# Patient Record
Sex: Male | Born: 1960 | Race: White | Hispanic: No | Marital: Married | Smoking: Never smoker
Health system: Southern US, Community
[De-identification: ages and names within clinical notes are randomized; demographics above are authoritative.]

## PROBLEM LIST (undated history)

## (undated) DIAGNOSIS — C4491 Basal cell carcinoma of skin, unspecified: Secondary | ICD-10-CM

## (undated) DIAGNOSIS — J302 Other seasonal allergic rhinitis: Secondary | ICD-10-CM

## (undated) DIAGNOSIS — R7303 Prediabetes: Secondary | ICD-10-CM

## (undated) DIAGNOSIS — H409 Unspecified glaucoma: Secondary | ICD-10-CM

## (undated) DIAGNOSIS — K859 Acute pancreatitis without necrosis or infection, unspecified: Secondary | ICD-10-CM

## (undated) HISTORY — DX: Basal cell carcinoma of skin, unspecified: C44.91

## (undated) HISTORY — PX: OTHER SURGICAL HISTORY: SHX169

## (undated) HISTORY — DX: Other seasonal allergic rhinitis: J30.2

## (undated) HISTORY — PX: WISDOM TOOTH EXTRACTION: SHX21

## (undated) HISTORY — DX: Unspecified glaucoma: H40.9

## (undated) HISTORY — DX: Acute pancreatitis without necrosis or infection, unspecified: K85.90

## (undated) HISTORY — DX: Prediabetes: R73.03

---

## 2006-05-03 ENCOUNTER — Encounter (INDEPENDENT_AMBULATORY_CARE_PROVIDER_SITE_OTHER): Payer: Self-pay | Admitting: *Deleted

## 2006-05-03 ENCOUNTER — Ambulatory Visit (HOSPITAL_COMMUNITY): Admission: RE | Admit: 2006-05-03 | Discharge: 2006-05-03 | Payer: Self-pay | Admitting: Emergency Medicine

## 2006-05-03 ENCOUNTER — Emergency Department (HOSPITAL_COMMUNITY): Admission: EM | Admit: 2006-05-03 | Discharge: 2006-05-03 | Payer: Self-pay | Admitting: Emergency Medicine

## 2006-05-06 ENCOUNTER — Encounter (INDEPENDENT_AMBULATORY_CARE_PROVIDER_SITE_OTHER): Payer: Self-pay | Admitting: *Deleted

## 2006-05-06 ENCOUNTER — Encounter: Payer: Self-pay | Admitting: Internal Medicine

## 2006-05-06 ENCOUNTER — Inpatient Hospital Stay (HOSPITAL_COMMUNITY): Admission: EM | Admit: 2006-05-06 | Discharge: 2006-05-11 | Payer: Self-pay | Admitting: Emergency Medicine

## 2006-05-07 ENCOUNTER — Encounter (INDEPENDENT_AMBULATORY_CARE_PROVIDER_SITE_OTHER): Payer: Self-pay | Admitting: *Deleted

## 2006-05-08 ENCOUNTER — Encounter: Payer: Self-pay | Admitting: Internal Medicine

## 2006-05-09 ENCOUNTER — Encounter (INDEPENDENT_AMBULATORY_CARE_PROVIDER_SITE_OTHER): Payer: Self-pay | Admitting: *Deleted

## 2006-05-09 ENCOUNTER — Encounter: Payer: Self-pay | Admitting: Internal Medicine

## 2006-05-10 ENCOUNTER — Encounter: Payer: Self-pay | Admitting: Internal Medicine

## 2006-05-11 ENCOUNTER — Encounter: Payer: Self-pay | Admitting: Internal Medicine

## 2006-05-16 ENCOUNTER — Ambulatory Visit: Payer: Self-pay | Admitting: Internal Medicine

## 2006-05-17 ENCOUNTER — Ambulatory Visit: Payer: Self-pay | Admitting: Internal Medicine

## 2006-05-19 ENCOUNTER — Ambulatory Visit: Payer: Self-pay | Admitting: Internal Medicine

## 2006-06-23 ENCOUNTER — Ambulatory Visit: Payer: Self-pay | Admitting: Internal Medicine

## 2006-06-23 LAB — CONVERTED CEMR LAB
Basophils Absolute: 0.1 10*3/uL (ref 0.0–0.1)
Basophils Relative: 1.1 % — ABNORMAL HIGH (ref 0.0–1.0)
Eosinophils Absolute: 0.4 10*3/uL (ref 0.0–0.6)
HCT: 43.5 % (ref 39.0–52.0)
Hemoglobin: 15.2 g/dL (ref 13.0–17.0)
MCHC: 35 g/dL (ref 30.0–36.0)
MCV: 85.8 fL (ref 78.0–100.0)
Monocytes Absolute: 0.4 10*3/uL (ref 0.2–0.7)
Neutro Abs: 4.5 10*3/uL (ref 1.4–7.7)
RDW: 12.7 % (ref 11.5–14.6)

## 2007-01-19 ENCOUNTER — Ambulatory Visit: Payer: Self-pay | Admitting: Internal Medicine

## 2007-01-19 LAB — CONVERTED CEMR LAB
Basophils Relative: 0.7 % (ref 0.0–1.0)
HCT: 44.2 % (ref 39.0–52.0)
MCHC: 34.3 g/dL (ref 30.0–36.0)
Neutrophils Relative %: 67.9 % (ref 43.0–77.0)
RBC: 5.22 M/uL (ref 4.22–5.81)

## 2010-03-16 ENCOUNTER — Ambulatory Visit: Payer: Self-pay | Admitting: Gastroenterology

## 2010-03-16 ENCOUNTER — Telehealth: Payer: Self-pay | Admitting: Internal Medicine

## 2010-03-16 DIAGNOSIS — Z8719 Personal history of other diseases of the digestive system: Secondary | ICD-10-CM

## 2010-03-16 DIAGNOSIS — R141 Gas pain: Secondary | ICD-10-CM

## 2010-03-16 DIAGNOSIS — R109 Unspecified abdominal pain: Secondary | ICD-10-CM

## 2010-03-16 DIAGNOSIS — R143 Flatulence: Secondary | ICD-10-CM

## 2010-03-16 DIAGNOSIS — Z85828 Personal history of other malignant neoplasm of skin: Secondary | ICD-10-CM

## 2010-03-16 DIAGNOSIS — R142 Eructation: Secondary | ICD-10-CM

## 2010-03-16 DIAGNOSIS — J309 Allergic rhinitis, unspecified: Secondary | ICD-10-CM | POA: Insufficient documentation

## 2010-03-16 DIAGNOSIS — C4491 Basal cell carcinoma of skin, unspecified: Secondary | ICD-10-CM | POA: Insufficient documentation

## 2010-03-16 DIAGNOSIS — K56609 Unspecified intestinal obstruction, unspecified as to partial versus complete obstruction: Secondary | ICD-10-CM | POA: Insufficient documentation

## 2010-03-16 DIAGNOSIS — R1033 Periumbilical pain: Secondary | ICD-10-CM | POA: Insufficient documentation

## 2010-03-17 ENCOUNTER — Ambulatory Visit: Payer: Self-pay | Admitting: Internal Medicine

## 2010-03-17 LAB — CONVERTED CEMR LAB
Basophils Relative: 0 % (ref 0.0–3.0)
Calcium: 9.1 mg/dL (ref 8.4–10.5)
Chloride: 103 meq/L (ref 96–112)
Creatinine, Ser: 0.9 mg/dL (ref 0.4–1.5)
Eosinophils Absolute: 0.3 10*3/uL (ref 0.0–0.7)
GFR calc non Af Amer: 92.16 mL/min (ref >60)
Lymphs Abs: 2 10*3/uL (ref 0.7–4.0)
MCHC: 34.7 g/dL (ref 30.0–36.0)
MCV: 86.7 fL (ref 78.0–100.0)
Monocytes Relative: 6.7 % (ref 3.0–12.0)
Platelets: 269 10*3/uL (ref 150.0–400.0)
RBC: 4.88 M/uL (ref 4.22–5.81)
Sodium: 139 meq/L (ref 135–145)
WBC: 14.4 10*3/uL — ABNORMAL HIGH (ref 4.5–10.5)

## 2010-03-18 ENCOUNTER — Ambulatory Visit: Payer: Self-pay | Admitting: Physician Assistant

## 2010-03-18 DIAGNOSIS — Z8719 Personal history of other diseases of the digestive system: Secondary | ICD-10-CM | POA: Insufficient documentation

## 2010-03-19 ENCOUNTER — Ambulatory Visit (HOSPITAL_COMMUNITY): Admission: RE | Admit: 2010-03-19 | Discharge: 2010-03-19 | Payer: Self-pay | Admitting: Gastroenterology

## 2010-03-19 LAB — CONVERTED CEMR LAB
ALT: 27 units/L (ref 0–53)
AST: 21 units/L (ref 0–37)
Albumin: 3.8 g/dL (ref 3.5–5.2)
Alkaline Phosphatase: 70 units/L (ref 39–117)
Amylase: 39 units/L (ref 27–131)
Basophils Absolute: 0 10*3/uL (ref 0.0–0.1)
Basophils Relative: 0.4 % (ref 0.0–3.0)
Eosinophils Relative: 3.7 % (ref 0.0–5.0)
Hemoglobin: 15.2 g/dL (ref 13.0–17.0)
Lipase: 23 units/L (ref 11.0–59.0)
MCV: 87.7 fL (ref 78.0–100.0)
Neutrophils Relative %: 71.3 % (ref 43.0–77.0)
Platelets: 271 10*3/uL (ref 150.0–400.0)
RBC: 5.1 M/uL (ref 4.22–5.81)
RDW: 13.7 % (ref 11.5–14.6)
Total Protein: 6.7 g/dL (ref 6.0–8.3)
WBC: 8.4 10*3/uL (ref 4.5–10.5)

## 2010-03-23 ENCOUNTER — Ambulatory Visit: Payer: Self-pay | Admitting: Internal Medicine

## 2010-06-29 NOTE — Progress Notes (Signed)
Summary: Triage  Phone Note Call from Patient Call back at [phone]   Caller: Patient Call For: Dr. Juanda Chance Reason for Call: Talk to Nurse Summary of Call: Abd pain, bloating/discomfort. "awake" all night Initial call taken by: Karna Christmas,  March 16, 2010 9:11 AM  Follow-up for Phone Call        Pt called to report abdominal pain x 2 days. States"I have not been able to sleep for 2 nights. " C/O bloating, crampy pain above belly button and around it. Denies reflux, constipation, diarrhea, nausea, vomiting and fever. States he has had "sweats at times." Patient took Tylenol without relief of pain. He took Oxycodone with relief last PM. Pt states he was treated for parasites in 2007.Patient scheduled to see Mike Gip, PA today at 2:30 PM Follow-up by: Jesse Fall RN,  March 16, 2010 12:45 PM

## 2010-06-29 NOTE — Assessment & Plan Note (Signed)
Summary: abd pain;pancreatitis//dn   History of Present Illness Visit Type: Follow-up Visit Primary GI MD: Lina Sar MD Primary Provider: Elvina Sidle, MD  Requesting Provider: na Chief Complaint: F/u for pancreatitis, patient saw Amy on 03-16-10. Pt  states that he is feeling better and denies any GI complaints History of Present Illness:   This is a 50 year old white male with acute  pancreatitis involving the tail of the pancreas demonstrated on a CT scan of the abdomen on 03/17/10. The scan was ordered because of an episode of periumbilical abdominal pain which occurred rather suddenly and lasted for about 2 days. Patient had a low-grade temperature recorded at home but not documented in our office. His white blood cell count was elevated to 14,000. The CT Scan of the abdomen did not show any structural  ab normality of the gallbladder or liver. His liver function tests including an amylase and lipase were normal. There was focal edema of the tail of the pancreas but no mass. He does not drink alcohol. He had a large  pizza meal  the night before. There is family history of gallbladder disease in a maternal grandmother   GI Review of Systems      Denies abdominal pain, acid reflux, belching, bloating, chest pain, dysphagia with liquids, dysphagia with solids, heartburn, loss of appetite, nausea, vomiting, vomiting blood, weight loss, and  weight gain.        Denies anal fissure, black tarry stools, change in bowel habit, constipation, diarrhea, diverticulosis, fecal incontinence, heme positive stool, hemorrhoids, irritable bowel syndrome, jaundice, light color stool, liver problems, rectal bleeding, and  rectal pain.    Current Medications (verified): 1)  Percocet 5-325 Mg Tabs (Oxycodone-Acetaminophen) .... Take 1 Tab Every 4-6 Hours For Pain 2)  Zyrtec-D Allergy & Congestion 5-120 Mg Xr12h-Tab (Cetirizine-Pseudoephedrine) .... As Needed 3)  Tylenol 325 Mg Tabs (Acetaminophen) ....  As Needed 4)  Multivitamins   Tabs (Multiple Vitamin) .... One Tablet By Mouth Once Daily  Allergies (verified): 1)  ! Nsaids  Past History:  Past Medical History: Small Bowel Enteritis- 05/06/2006 PANCREATITIS (ICD-577.0) SMALL BOWEL OBSTRUCTION, HX OF (ICD-V12.79) ABDOMINAL PAIN, PERIUMBILIC (ICD-789.05) CARCINOMA, BASAL CELL, HX OF (ICD-V10.83) ALLERGIC RHINITIS CAUSE UNSPECIFIED (ICD-477.9) ABDOMINAL BLOATING (ICD-787.3) ABDOMINAL PAIN, LOWER (ICD-789.09) HX OF ACUTE ENTERITIS/TRANSIENT SBO 12/07  Past Surgical History: Reviewed history from 03/16/2010 and no changes required. Unremarkable  Family History: Reviewed history from 03/16/2010 and no changes required. Family History of Heart Disease: mother Family History Melanoma- Father Family Hx of HTN - father No FH of Colon Cancer:  Social History: Reviewed history from 03/16/2010 and no changes required. 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  Alcohol Use - no Daily Caffeine Use: 2 daily  Illicit Drug Use - no  Review of Systems  The patient denies allergy/sinus, anemia, anxiety-new, arthritis/joint pain, back pain, blood in urine, breast changes/lumps, change in vision, confusion, cough, coughing up blood, depression-new, fainting, fatigue, fever, headaches-new, hearing problems, heart murmur, heart rhythm changes, itching, menstrual pain, muscle pains/cramps, night sweats, nosebleeds, pregnancy symptoms, shortness of breath, skin rash, sleeping problems, sore throat, swelling of feet/legs, swollen lymph glands, thirst - excessive , urination - excessive , urination changes/pain, urine leakage, vision changes, and voice change.         Pertinent positive and negative review of systems were noted in the above HPI. All other ROS was otherwise negative.   Vital Signs:  Patient profile:   51 year old male Height:  78 inches Weight:      266 pounds BMI:     30.85 BSA:     2.55 Pulse rate:    88 / minute Pulse rhythm:   regular BP sitting:   126 / 74  (left arm) Cuff size:   regular  Vitals Entered By: Ok Anis CMA (March 23, 2010 2:47 PM)  Physical Exam  General:  Well developed, well nourished, no acute distress. Eyes:  PERRLA, no icterus. Lungs:  Clear throughout to auscultation. Heart:  Regular rate and rhythm; no murmurs, rubs,  or bruits. Abdomen:  abdomen was soft and nondistended with normoactive bowel sounds. I could not elicit any tenderness in any of the quadrants. There was no fullness. Liver edge at costal margin. Extremities:  No clubbing, cyanosis, edema or deformities noted. Skin:  Intact without significant lesions or rashes. Psych:  Alert and cooperative. Normal mood and affect.   Impression & Recommendations:  Problem # 1:  PANCREATITIS (ICD-577.0) Patient had evidence of acute focal pancreatitis on CT scan imaging correlating with the episode of acute abdominal pain. Ethiology  is not clear. Pancreatic and liver enzymes have been normal. He does not drink alcohol. There is no mass in the pancreas. Biliary pancreatitis is still a possibility. The patient is not interested in gallbladder surgery at this time.  There is not enough evidence to request surgical consultation anyway.Marland Kitchen He should stay on a low-fat diet and report to [place] any recurrence of the abdominal pain. I would consider a HIDA scan at some point and repeating his liver tests and pancreatic enzymes during an attack.  Problem # 2:  SMALL BOWEL OBSTRUCTION (ICD-560.9) Patient had an episode of acute abdominal pain in 2008. This is currently not a problem.  Patient Instructions: 1)  Please follow up as needed. 2)  low fat diet 3)  Call us with symptoms and any recurrence of abdominal pain so we could consider a HIDA scan and repeating pancreatic enzymes. 4)  Copy sent to : Elvina Sidle, MD 5)  The medication list was reviewed and reconciled.  All changed / newly prescribed medications  were explained.  A complete medication list was provided to the patient / caregiver.

## 2010-06-29 NOTE — Discharge Summary (Signed)
Summary: Partial Small Bowel Obstruction   NAME:  Barry Thomas, Barry Thomas                 ACCOUNT NO.:  [id number]   MEDICAL RECORD NO.:  [id number]          PATIENT TYPE:  INP   LOCATION:  1609                         FACILITY:  [facility]   PHYSICIAN:  Andres Shad. Rudean Curt, MD     DATE OF BIRTH:  1960/11/24   DATE OF ADMISSION:  05/06/2006  DATE OF DISCHARGE:  05/11/2006                               DISCHARGE SUMMARY   DISCHARGE DIAGNOSES:  Small bowel enteritis of unknown cause.   DISCHARGE MEDICATIONS:  Protonix 40 mg p.o. daily.   HOSPITAL COURSE:  Barry Thomas is a 50 year old male with no significant  past medical history who was admitted to [facility] on  May 06, 2006 with the chief complaint of abdominal pain of three  weeks duration.  The abdominal pain was crampy in nature and was  associated with some bouts of diarrhea.  He denied any blood in the  diarrhea.  He did not have any nausea or vomiting. He also denied fever  or constitutional symptoms.  He was seen in the emergency department  approximately three days before admission where he was found to have  mildly elevated white count.  He was given IV fluids and oral pain  medication and discharged.  A followup ultrasound was performed that was  normal.  He then returned to the emergency department on May 06, 2006 complaining of abdominal pain, cramps and some diarrhea.  On  admission he was found to have a temperature of 99.1, blood pressure  141/92, pulse 100, respiratory rate 16 and oxygen saturation 96% on room  air.  His laboratory studies included a white blood cell count of 12,000  and were otherwise within normal limits.  CT scan of the abdomen was  performed which showed some thickening of the distal loops of the small  bowel with free pelvic fluid.  This was compatible with enteritis with a  broad differential diagnosis including infectious, inflammatory and  ischemic processes.  No vascular disease was noted on  this study.   The patient was admitted to the hospital where he was kept NPO at first  and given Protonix.  He was initially treated with antibiotics to cover  bowel flora and gastroenterology was consulted.  A number of studies  were performed.  These included an upper GI series on May 08, 2006  which showed a proximal area of mild inflammation with suboptimal  filling.  This was consistent with an inflammatory process of the small  bowel.  EGD was performed on May 09, 2006 with small bowel  enteroscopy.  This study did not reveal any abnormalities.  Random  jejunal biopsies were taken.  The enteroscopy reached as far as the  Ligament of Treitz.  At the time of discharge the small bowel biopsies  were not available.  A capsule enteroscopy was underway at the time of  discharge.  The patient remained clinically well.  He was able to  advance his diet to a full diet without complications.  IV fluids were  discontinued the day  before discharge.  At that time gastroenterology  recommended discharge and followup in the office.   ASSESSMENT:  Patient is a 50 year old with small bowel enteritis of  unknown cause, likely infectious versus inflammatory.   PLAN:  1. Followup in the office with Dr. Juanda Chance from gastroenterology.  2. Continue Protonix 40 mg daily as an outpatient.  3. Avoid nonsteroidal anti-inflammatory drugs.  4. Followup results of small bowel jejunal biopsies as well as      enteroscopy.      Andres Shad. Rudean Curt, MD  Electronically Signed     PML/MEDQ  D:  05/11/2006  T:  05/11/2006  Job:  [id number]   cc:   Hedwig Morton. Juanda Chance, MD  [address]  [place]  [place] [address]   Magnus Sinning. Rice, M.D.  Fax: [phone]

## 2010-06-29 NOTE — Procedures (Signed)
Summary: EGD   EGD  Procedure date:  05/09/2006  Findings:      Location: [facility]   Patient Name: Barry Thomas, Barry Thomas MRN: [id number] Procedure Procedures: Small Bowel Enteroscopy CPT: 44360.    with biopsy(s). CPT: W4506749.  Personnel: Endoscopist: Lizzett Nobile L. Juanda Chance, MD.  Exam Location: Exam performed in Endoscopy Suite.  Patient Consent: Procedure, Alternatives, Risks and Benefits discussed, consent obtained, from patient. Consent was obtained by the RN.  Indications  Abnormal Exams, Studies: CT scan, abnormal, do not suspect malignancy.  Symptoms: Abdominal pain, location: diffuse.  Comments: dilated jejunum, partial small bowl obstruction on SBFT, elevated eos. count History  Current Medications: Patient is not currently taking Coumadin.  Comments: Patient history reviewed/updated, physical exam performed prior to initiation of sedation? Pre-Exam Physical: Performed May 09, 2006  Entire physical exam was normal.  Comments: Pt. history reviewed/updated, physical exam performed prior to initiation of sedation?yes Exam Exam Info: Maximum depth of insertion Jejunum, intended Jejunum. Vocal cords visualized. Gastric retroflexion performed. Images taken. ASA Classification: I. Tolerance: good.  Sedation Meds: Patient assessed and found to be appropriate for moderate (conscious) sedation. Fentanyl 75 mcg. given IV. Versed 8 mg. given IV. Cetacaine Spray 2 sprays given aerosolized.  Monitoring: BP and pulse monitoring done. Oximetry used. Supplemental O2 given  Findings Normal: Distal Esophagus.  - Normal: Duodenal Apex to Jejunum. Comments: normal calibre jejunum and duodenum, no inflammatory changes,s/p random biopsies.  Normal: Antrum.  OTHER FINDING: in Jejunum. Comments: reached distal to the Lig of treitz.   Assessment Normal examination.  Comments: s/p random jejunal biopsies Events  Unplanned Intervention: No unplanned interventions were  required.  Unplanned Events: There were no complications. Plans Medication(s): Await pathology.  Comments: small bowl capsule endoscopy Disposition: After procedure patient sent to recovery.    cc: Renford Dills, MD   This report was created from the original endoscopy report, which was reviewed and signed by the above listed endoscopist.

## 2010-06-29 NOTE — Consult Note (Signed)
Summary: Abdominal Pain   NAME:  Barry Thomas, Barry Thomas                 ACCOUNT NO.:  [id number]   MEDICAL RECORD NO.:  [id number]          PATIENT TYPE:  INP   LOCATION:  1609                         [facility]ACILITY:  [facility]   PHYSICIAN:  Anselm Pancoast. Weatherly, M.D.DATE OF BIRTH:  05-04-1961   DATE OF CONSULTATION:  05/07/2006  DATE OF DISCHARGE:                                 CONSULTATION   CHIEF COMPLAINT:  Abdominal pain.   HISTORY:  Barry Thomas is a 50 year old Caucasian male admitted  through the ER by Dr. Nehemiah Settle with intermittent episodes of epigastric  pain that has occurred over a 2-week interval.  The patient's first  symptoms sound more like a right upper quadrant cramping sensation, he  did have a loose bowel movement and he saw his physician several days  later.  Ultrasound of the gallbladder was performed which was reported  to be normal.  His symptoms subsided and then about 3 days later he had  another episode of pain, sort of crampy and bloated, does not actually  throw up, usually it is associated with a large kind of liquid bowel  movement and then on Friday night a similar episode of pain occurred and  after several hours he came to the emergency room.  He was evaluated in  the ER, treated with pain medication, his white count was 12,700 and  plain abdominal films and then a CT was performed.  The CT showed a  little thickening of the distal small bowel but it was not that of  obvious obstruction and he was seen by the medical physician who  arranged for his admission but asked for a general surgical consultation  in case surgery would be needed.  On questioning with the patient, he  has not had an episode of kind of frequent diarrhea, weight loss, any  foods that he is normally not able to tolerate, he is moderately  overweight and the only other history of similar family members, etc.,  he has a grandfather or possibly a father-in-law who has had Clostridium  difficile  colitis that he has been around but the patient himself has  not been on any oral antibiotics to kind of predispose him to a possible  Clostridium difficile colitis.  Also on his CT there is no evidence of  any thickening of the colon and there is also no evidence of any  thickening of the gallbladder noted on the CT.   On physical exam in the emergency room he was afebrile, was fairly  comfortable, he had received some pain medications initially but it had  been 6 or 7 hours since this had been given and on bowel sounds there  was not any hyperactive obstructive type bowel symptoms and on rectal  exam the stools were hemoccult negative and very little if any true  stool in his rectum when I did his rectal exam.  I recommended that we  get a Clostridium difficile and also kind of  follow along.  I doubt  that this is a surgical problem.  I think we have  now two or three  episodes spaced several days apart and a little thickening noted in the  distal small bowel, a possible little intermittent intussusception would  possibly give this type of intermittent crampy and bloated pain with a  loose bowel movement but he has not had any blood in his stool and the  CT is really not as thickened as I would expect if the patient has early  Crohn's disease and he has also not had the symptoms of any significant  time duration for weight loss.  In followup the Clostridium difficile  that was ordered was negative and now approximately 24 hours later he is  resting comfortable, he is tolerating clear liquid diet, his white count  today is 8800 and he is not distended or bloated.  He has really not had  any further significant diarrhea and I would think a small bowel series  is needed on Monday but doubt that this is a surgical problem.  It is  probably a viral gastroenteritis with these episodes of pain.  The pain  itself is fairly typical for a gallbladder attack but there is no stones  seen on the  ultrasound or thickening of the gallbladder so that is  most unlikely.  We will arrange for a small-bowel series to be performed  today.  I think the patient is going down for x-ray at this time to make  sure there is no small bowel dilatation or blockage but clinically that  does not appear to be an issue.  I will follow along with the patient  but doubt that a surgical problem exists.           ______________________________  Anselm Pancoast. Zachery Dakins, M.D.     WJW/MEDQ  D:  05/07/2006  T:  05/07/2006  Job:  [id number]

## 2010-06-29 NOTE — Initial Assessments (Signed)
Summary: Abdominal pain  NAME:  Barry Thomas, Barry Thomas                 ACCOUNT NO.:  [id number]   MEDICAL RECORD NO.:  [id number]          PATIENT TYPE:  EMS   LOCATION:  ED                           FACILITY:  [facility]   PHYSICIAN:  Deirdre Peer. Polite, M.D. DATE OF BIRTH:  09-14-1960   DATE OF ADMISSION:  05/06/2006  DATE OF DISCHARGE:                              HISTORY & PHYSICAL   CHIEF COMPLAINT:  Abdominal pain.   HISTORY OF PRESENT ILLNESS:  Mr. Langan is a 50 year old male with no  significant past medical history, comes to the ED with approximately 3  week history of crampy abdominal pain.  According to the patient, he was  in his usual state of health until approximately 9-10 days before  Thanksgiving where he experienced crampy abdominal pain with some  associated bouts of diarrhea.  Denied any blood in the diarrhea.  Shortly after Thanksgiving, symptoms abated.  However, 10 days later,  symptoms seemed to recur with crampy abdominal pain and loose stool.  The patient has been seen in the ED last Tuesday, been evaluated, was  found to have a mildly elevated white count, was given IV fluids,  analgesia and discharged.  He had a follow up ultrasound reportedly  within normal limits.  The patient returns to the ED Friday night with  same abdominal pains, cramps, and some associated diarrhea.  The patient  was febrile on admit, 99.1, BP 141/92, pulse 100, respiratory rate 16,  saturating 96%.  White count elevated at 12.  UA with an elevated  specific gravity suggesting component of dehydration.  BMET within  normal limits.  The patient underwent CT of his abdomen which showed  some thickening of the wall of the distal loops of small bowel with free  pelvic fluid compatible with infectious versus inflammatory or ischemic  enteritis.  It was also noted that there was no vascular disease;  therefore, making ischemic enteritis less likely.  Because of the  prolonged symptoms and abnormalities  on CT, admission is recommended for  further evaluation and treatment.  The patient does admit to some  chills, denies any fever, does have well water at home but consumes  essentially bottled water.  A few days prior to the initiation of this  event, the patient did eat some [place] food; however, his wife ate  [place] food as well, a different dish, but she has not had any  symptoms.  He denies any travel, denies any recent antibiotics.  Again,  as stated, admission is deemed necessary for further evaluation and  treatment.   PAST MEDICAL HISTORY:  As stated above.   MEDICATIONS:  The patient has been prescribed Percocet from the ED a few  days ago.   SOCIAL HISTORY:  Negative for tobacco, alcohol, or drugs.   PAST SURGICAL HISTORY:  None.   ALLERGIES:  Describes allergies to NONSTEROIDAL ANTI-INFLAMMATORY DRUGS  which cause swelling and stomach cramps.   FAMILY HISTORY:  Mother with status post pacer.  Father history of  melanoma, hypertension.   REVIEW OF SYSTEMS:  As stated in HPI.  PHYSICAL EXAMINATION:  GENERAL:  Alert and oriented x3.  No apparent  distress.  VITAL SIGNS:  T-max 99.1, T current 98.2.  BP 127/68, pulse 82,  respiratory rate 16.  HEENT:  Within normal limits.  CHEST:  Clear to auscultation bilaterally without rales or rhonchi.  CARDIOVASCULAR:  Regular.  No S3.  ABDOMEN:  Soft, nontender, no mass appreciated.  EXTREMITIES:  No cyanosis, clubbing, or edema.  NEUROLOGIC:  Nonfocal.   DATA:  As stated in the HPI.   ASSESSMENT:  1. Abdominal pain x3 weeks.  2. Abnormal CT showing thickening of the distal loops of the small      bowel.  Differential includes infectious versus inflammatory,      questionable possible early small bowel obstruction.  3. Leukocytosis.  Recommend that the patient be admitted to a medicine      floor bed.  The patient will be given IV fluids, empiric      antibiotics, placed on bowel rest.  Obtain a surgery consult       because of the CT showing questionable early possible small bowel      obstruction plus/minus a gastroenterology consult for further      evaluation of potential inflammatory bowel ds.  The patient will      have followup labs and will have stool cultures.  We will make      further recommendations as deemed necessary.      Deirdre Peer. Polite, M.D.  Electronically Signed     RDP/MEDQ  D:  05/06/2006  T:  05/06/2006  Job:  [id number]

## 2010-06-29 NOTE — Procedures (Signed)
Summary: SBCE   Capsule Endoscopy  Procedure date:  05/10/2006  Findings:      Performing Location: [facility]   NAME:  Barry Thomas, Barry Thomas                 ACCOUNT NO.:  [id number]   MEDICAL RECORD NO.:  [id number]          PATIENT TYPE:  INP   LOCATION:  1609                         FACILITY:  [facility]   PHYSICIAN:  Iva Boop, MD,FACGDATE OF BIRTH:  1961-01-30   DATE OF PROCEDURE:  05/10/2006  DATE OF DISCHARGE:  05/11/2006                               OPERATIVE REPORT   PROCEDURE:  Small-bowel capsule endoscopy.   INDICATIONS:  A 50 year old man with cramping diarrhea and CT small-  bowel fell-through showing some sort of enteritis with inflammation in  the mid-small bowel.  EGD/enteroscopy were negative.   PROCEDURE DATA:  A gastric passage time was 1 hour 25 minutes.  The  capsule did reach the distal ileum but not the colon.   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   RECOMMENDATIONS:  Stool for ova and parasites, treatment per Dr. Juanda Chance.  Watch for capsule passage.  I doubt obstruction based upon CT and small-  bowel follow-through.  These findings were discussed with Dr. Juanda Chance.      Iva Boop, MD,FACG  Electronically Signed     CEG/MEDQ  D:  05/14/2006  T:  05/15/2006  Job:  [id number]

## 2010-06-29 NOTE — Assessment & Plan Note (Signed)
Summary: abd pain x 2 days hx SBO/regina   History of Present Illness Visit Type: new patient  Primary GI MD: Lina Sar MD Primary Provider: Elvina Sidle, MD  Requesting Provider: na Chief Complaint: Lower abd pain, bloating, and frequent BMs History of Present Illness:   PLEASANT 49yo. HE WAS HOSPITALIZED IN  12/07 WITH ACUTE ABDOMINAL PAIN/PARTIAL SBO. HE DID NOT REQUIRE SURGERY. HE HAD CT SHOWING INFLAMMATORY CHANGES IN THE ILEUM C/W AN ENTERITIS. HE HAD CAPSULE ENDOSCOPY THAT ? NEMATODES IN ILEUM, AND A SMALL JEJUNAL ULCER. HE HAD AN ASSOCIATED EOSINOPHILIA. HE WAS TREATED WITH 3 DAYS OF VERMOX ON DISCHARGE FROM HOSPITAL. IT WAS NOT DEFINITE  THAT OBSTRUCTIVE SXS WERE DUE TO NEMATODE INFECTION  BUT HE RESOLVED AND HAD NO FURTHER SXS. HE COME IN NOW WITH ONSET ABOUT 48 HOURS AGO WITH MID ABDOMINAL PAIN, AND SAYS IT FEELS THE SAME AS WHEN HE HAD THE OBSTRUCTION BUT NOT AS SEVERE. HE IS HAVING MORE PAIN AT NIGHT AND CAN'T SLEEP. NO N/V, AND EATING OK. NO FEVER. BM'S NORMAL, NO DIARRHEA OR HEME.PAIN IS CRAMPY SHARP, NON RADIATING, NO WORSE POST PRANDIALLY. HE HAS NOT BEEN ON ANY NEW MEDS,ABX ETC. HE SAYS HE HAS NOT HAD ANY PROBLEMS OVER THE PAST 3 1/2 YEARS.    GI Review of Systems    Reports abdominal pain and  bloating.     Location of  Abdominal pain: lower abdomen.    Denies acid reflux, belching, chest pain, dysphagia with liquids, dysphagia with solids, heartburn, loss of appetite, nausea, vomiting, vomiting blood, weight loss, and  weight gain.        Denies anal fissure, black tarry stools, change in bowel habit, constipation, diarrhea, diverticulosis, fecal incontinence, heme positive stool, hemorrhoids, irritable bowel syndrome, jaundice, light color stool, liver problems, rectal bleeding, and  rectal pain.    Current Medications (verified): 1)  Oxycodone-Acetaminophen 5-325 Mg Tabs (Oxycodone-Acetaminophen) .... As Needed 2)  Zyrtec-D Allergy & Congestion 5-120 Mg Xr12h-Tab  (Cetirizine-Pseudoephedrine) .... As Needed 3)  Tylenol 325 Mg Tabs (Acetaminophen) .... As Needed 4)  Multivitamins   Tabs (Multiple Vitamin) .... One Tablet By Mouth Once Daily  Allergies (verified): 1)  ! 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  Alcohol Use - no Daily Caffeine Use: 2 daily  Illicit Drug Use - no  Review of Systems       The patient complains of allergy/sinus.  The patient denies anemia, anxiety-new, arthritis/joint pain, back pain, blood in urine, breast changes/lumps, change in vision, confusion, cough, coughing up blood, depression-new, fainting, fatigue, fever, headaches-new, hearing problems, heart murmur, heart rhythm changes, itching, muscle pains/cramps, night sweats, nosebleeds, shortness of breath, skin rash, sleeping problems, sore throat, swelling of feet/legs, swollen lymph glands, thirst - excessive, urination - excessive, urination changes/pain, urine leakage, vision changes, and voice change.         SEE HPI  Vital Signs:  Patient profile:   50 year old male Height:      78 inches Weight:      269 pounds BMI:     31.20 BSA:     2.56 Pulse rate:  96 / minute Pulse rhythm:   regular BP sitting:   124 / 80  (left arm) Cuff size:   regular  Vitals Entered By: Ok Anis CMA (March 16, 2010 2:43 PM)  Physical Exam  General:  Well developed, well nourished, no acute distress. Head:  Normocephalic and atraumatic. Eyes:  PERRLA, no icterus. Neck:  Supple; no masses or thyromegaly. Lungs:  Clear throughout to  auscultation. Heart:  Regular rate and rhythm; no murmurs, rubs,  or bruits. Abdomen:  SOFT, MILD TENDERNESS LMQ, NO DISTENTION, NO GUARDING, NO MASS OR HSM,BS+ Rectal:  NOT DONE Extremities:  No clubbing, cyanosis, edema or deformities noted. Neurologic:  Alert and  oriented x4;  grossly normal neurologically. Psych:  Alert and cooperative. Normal mood and affect.   Impression & Recommendations:  Problem # 1:  ABDOMINAL PAIN, PERIUMBILIC (ICD-789.05) Assessment New 49 YO MALE  WITH HX OF AN ACUTE ENTERITIS WITH PARTIAL SBO 12/07 POSSIBLE RELATED TO NEMATODE INFECTION. NOW WITH 2 DAY HX OF RECURRENT MID ABDOMINAL CRAMPING,PAIN-REMINISCENT OF PREVIOUS SXS.  R/O ENTERITIS/PARTIAL SBO,R/O UNDERLYING IBD, OTHER INFECTIOUS ETIOLOGY. LABS AS BELOW CT ABDOMEN/PELVIS TOMORROW PERCOCET 5/325 Q 4-6 HRS AS NEEDED FOR PAIN#40/0 FURTHER WORKUP PENDING ABOVE Orders: TLB-BMP (Basic Metabolic Panel-BMET) (80048-METABOL) TLB-CRP-High Sensitivity (C-Reactive Protein) (86140-FCRP) TLB-CBC Platelet - w/Differential (85025-CBCD) TLB-Lipase (83690-LIPASE) CT Abdomen/Pelvis with Contrast (CT Abd/Pelvis w/con)  Patient Instructions: 1)  Please go to lab, basement level. 2)  We have  given you a prescription to take to your pharmacy for Percocet for pain. 3)  We scheduled the CT Scan for tomorrow 03-17-10. 4)  Directions and contrast  provided. 5)  Copy sent to : Elvina Sidle, MD  6)  The medication list was reviewed and reconciled.  All changed / newly prescribed medications were explained.  A complete medication list was provided to the patient / caregiver. Prescriptions: PERCOCET 5-325 MG TABS (OXYCODONE-ACETAMINOPHEN) Take 1 tab every 4-6 hours for pain  #40 x 0   Entered by:   Lowry Ram NCMA   Authorized by:   Sammuel Cooper PA-c   Signed by:   Lowry Ram NCMA on 03/16/2010   Method used:   Printed then faxed to ...       [facility]  [address] [id number] (retail)       [address]        [place], [place]  [address]       Ph: [phone]       Fax: [phone]   RxID:   [id number]

## 2010-10-15 NOTE — Op Note (Signed)
NAMEANEESH, FALLER                 ACCOUNT NO.:  [id number]   MEDICAL RECORD NO.:  [id number]          PATIENT TYPE:  INP   LOCATION:  1609                         FACILITY:  [facility]   PHYSICIAN:  Iva Boop, MD,FACGDATE OF BIRTH:  1960/12/06   DATE OF PROCEDURE:  05/10/2006  DATE OF DISCHARGE:  05/11/2006                               OPERATIVE REPORT   PROCEDURE:  Small-bowel capsule endoscopy.   INDICATIONS:  A 50 year old man with cramping diarrhea and CT small-  bowel fell-through showing some sort of enteritis with inflammation in  the mid-small bowel.  EGD/enteroscopy were negative.   PROCEDURE DATA:  A gastric passage time was 1 hour 25 minutes.  The  capsule did reach the distal ileum but not the colon.   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   RECOMMENDATIONS:  Stool for ova and parasites, treatment per Dr. Juanda Chance.  Watch for capsule passage.  I doubt obstruction based upon CT and small-  bowel follow-through.  These findings were discussed with Dr. Juanda Chance.      Iva Boop, MD,FACG  Electronically Signed     CEG/MEDQ  D:  05/14/2006  T:  05/15/2006  Job:  [id number]

## 2010-10-15 NOTE — Discharge Summary (Signed)
Barry Thomas, Barry Thomas                 ACCOUNT NO.:  [id number]   MEDICAL RECORD NO.:  [id number]          PATIENT TYPE:  INP   LOCATION:  1609                         FACILITY:  [facility]   PHYSICIAN:  Andres Shad. Rudean Curt, MD     DATE OF BIRTH:  Jan 17, 1961   DATE OF ADMISSION:  05/06/2006  DATE OF DISCHARGE:  05/11/2006                               DISCHARGE SUMMARY   DISCHARGE DIAGNOSES:  Small bowel enteritis of unknown cause.   DISCHARGE MEDICATIONS:  Protonix 40 mg p.o. daily.   HOSPITAL COURSE:  Barry Thomas is a 50 year old male with no significant  past medical history who was admitted to [facility] on  May 06, 2006 with the chief complaint of abdominal pain of three  weeks duration.  The abdominal pain was crampy in nature and was  associated with some bouts of diarrhea.  He denied any blood in the  diarrhea.  He did not have any nausea or vomiting. He also denied fever  or constitutional symptoms.  He was seen in the emergency department  approximately three days before admission where he was found to have  mildly elevated white count.  He was given IV fluids and oral pain  medication and discharged.  A followup ultrasound was performed that was  normal.  He then returned to the emergency department on May 06, 2006 complaining of abdominal pain, cramps and some diarrhea.  On  admission he was found to have a temperature of 99.1, blood pressure  141/92, pulse 100, respiratory rate 16 and oxygen saturation 96% on room  air.  His laboratory studies included a white blood cell count of 12,000  and were otherwise within normal limits.  CT scan of the abdomen was  performed which showed some thickening of the distal loops of the small  bowel with free pelvic fluid.  This was compatible with enteritis with a  broad differential diagnosis including infectious, inflammatory and  ischemic processes.  No vascular disease was noted on this study.   The patient was admitted to  the hospital where he was kept NPO at first  and given Protonix.  He was initially treated with antibiotics to cover  bowel flora and gastroenterology was consulted.  A number of studies  were performed.  These included an upper GI series on May 08, 2006  which showed a proximal area of mild inflammation with suboptimal  filling.  This was consistent with an inflammatory process of the small  bowel.  EGD was performed on May 09, 2006 with small bowel  enteroscopy.  This study did not reveal any abnormalities.  Random  jejunal biopsies were taken.  The enteroscopy reached as far as the  Ligament of Treitz.  At the time of discharge the small bowel biopsies  were not available.  A capsule enteroscopy was underway at the time of  discharge.  The patient remained clinically well.  He was able to  advance his diet to a full diet without complications.  IV fluids were  discontinued the day before discharge.  At that time gastroenterology  recommended discharge and followup in the office.   ASSESSMENT:  Patient is a 50 year old with small bowel enteritis of  unknown cause, likely infectious versus inflammatory.   PLAN:  1. Followup in the office with Dr. Juanda Chance from gastroenterology.  2. Continue Protonix 40 mg daily as an outpatient.  3. Avoid nonsteroidal anti-inflammatory drugs.  4. Followup results of small bowel jejunal biopsies as well as      enteroscopy.      Andres Shad. Rudean Curt, MD  Electronically Signed     PML/MEDQ  D:  05/11/2006  T:  05/11/2006  Job:  [id number]   cc:   Hedwig Morton. Juanda Chance, MD  [address]  [place]  [place] [address]   Magnus Sinning. Rice, M.D.  Fax: [phone]

## 2010-10-15 NOTE — Consult Note (Signed)
Barry Thomas, Barry Thomas                 ACCOUNT NO.:  [id number]   MEDICAL RECORD NO.:  [id number]          PATIENT TYPE:  INP   LOCATION:  1609                         FACILITY:  [facility]   PHYSICIAN:  Anselm Pancoast. Weatherly, M.D.DATE OF BIRTH:  [birth year]   DATE OF CONSULTATION:  05/07/2006  DATE OF DISCHARGE:                                 CONSULTATION   CHIEF COMPLAINT:  Abdominal pain.   HISTORY:  Barry Thomas is a 50 year old Caucasian male admitted  through the ER by Dr. Nehemiah Settle with intermittent episodes of epigastric  pain that has occurred over a 2-week interval.  The patient's first  symptoms sound more like a right upper quadrant cramping sensation, he  did have a loose bowel movement and he saw his physician several days  later.  Ultrasound of the gallbladder was performed which was reported  to be normal.  His symptoms subsided and then about 3 days later he had  another episode of pain, sort of crampy and bloated, does not actually  throw up, usually it is associated with a large kind of liquid bowel  movement and then on Friday night a similar episode of pain occurred and  after several hours he came to the emergency room.  He was evaluated in  the ER, treated with pain medication, his white count was 12,700 and  plain abdominal films and then a CT was performed.  The CT showed a  little thickening of the distal small bowel but it was not that of  obvious obstruction and he was seen by the medical physician who  arranged for his admission but asked for a general surgical consultation  in case surgery would be needed.  On questioning with the patient, he  has not had an episode of kind of frequent diarrhea, weight loss, any  foods that he is normally not able to tolerate, he is moderately  overweight and the only other history of similar family members, etc.,  he has a grandfather or possibly a father-in-law who has had Clostridium  difficile colitis that he has been around  but the patient himself has  not been on any oral antibiotics to kind of predispose him to a possible  Clostridium difficile colitis.  Also on his CT there is no evidence of  any thickening of the colon and there is also no evidence of any  thickening of the gallbladder noted on the CT.   On physical exam in the emergency room he was afebrile, was fairly  comfortable, he had received some pain medications initially but it had  been 6 or 7 hours since this had been given and on bowel sounds there  was not any hyperactive obstructive type bowel symptoms and on rectal  exam the stools were hemoccult negative and very little if any true  stool in his rectum when I did his rectal exam.  I recommended that we  get a Clostridium difficile and also kind of  follow along.  I doubt  that this is a surgical problem.  I think we have now two or three  episodes spaced several days apart and a little thickening noted in the  distal small bowel, a possible little intermittent intussusception would  possibly give this type of intermittent crampy and bloated pain with a  loose bowel movement but he has not had any blood in his stool and the  CT is really not as thickened as I would expect if the patient has early  Crohn's disease and he has also not had the symptoms of any significant  time duration for weight loss.  In followup the Clostridium difficile  that was ordered was negative and now approximately 24 hours later he is  resting comfortable, he is tolerating clear liquid diet, his white count  today is 8800 and he is not distended or bloated.  He has really not had  any further significant diarrhea and I would think a small bowel series  is needed on Monday but doubt that this is a surgical problem.  It is  probably a viral gastroenteritis with these episodes of pain.  The pain  itself is fairly typical for a gallbladder attack but there is no stones  seen on the ultrasound or thickening of the  gallbladder so that is  most unlikely.  We will arrange for a small-bowel series to be performed  today.  I think the patient is going down for x-ray at this time to make  sure there is no small bowel dilatation or blockage but clinically that  does not appear to be an issue.  I will follow along with the patient  but doubt that a surgical problem exists.           ______________________________  Anselm Pancoast. Zachery Dakins, M.D.     WJW/MEDQ  D:  05/07/2006  T:  05/07/2006  Job:  [id number]

## 2010-10-15 NOTE — Assessment & Plan Note (Signed)
[facility]                         GASTROENTEROLOGY OFFICE NOTE   NAME:HORNERWoodroe, Vogan                     MRN:          [id number]  DATE:05/17/2006                            DOB:          08-02-1960    Mr. Kassing is a 50 year old gentleman who is here after a  hospitalization at [facility] for partial small-bowel  obstruction which resolved without surgery.  He presented to the  emergency room with severe abdominal pain and radiographic evidence of  small-bowel obstruction.  He responded to bowel rest.  CT scan of the  abdomen showed inflammatory changes in the terminal ileum.  Small-bowel  follow-through after several days of bowel rest was normal.  Small-bowel  obstruction resolved.  Because of eosinophilia and suspicion that he may  have either inflammatory bowel disease or a lymphoma, he underwent small-  bowel capsule endoscopy which showed nematodes within the distal small-  bowel and also question of some ova from the parasites in the distal  small-bowel.  The capsule never reached the colon.  There was a small  jejunal ulceration in the proximal jejunum.  His symptoms have  completely resolved and he has been at home now for 2 weeks and has  completed a course of Vermox 200 mg twice a day for 3 days.  Also, his  wife completed a course of it.  His bowel habits are regular, he denies  abdominal pain, and he has been able to eat a regular diet.   MEDICATIONS:  Protonix 40 mg p.o. daily.   PHYSICAL EXAMINATION:  VITAL SIGNS:  Blood pressure 118/80, pulse 82,  and weight 264 pounds.  GENERAL:  He was alert, oriented, in no distress.  LUNGS:  Clear to auscultation.  CARDIAC:  Normal S1, normal S2.  ABDOMEN:  Soft, mildly protuberant.  There were normal active bowel  sounds.  No abnormal rushes, no tenderness.  RECTAL:  Not repeated.   IMPRESSION:  A 50 year old white male with resolved small-bowel  obstruction, findings of  nematodes or hookworm within the distal small-  bowel which may have caused his eosinophilia as well as his symptoms.  He has been asymptomatic after treatment with Vermox.   PLAN:  1. Repeat CBC with a diff today.  If eosinophilia persists, will      obtain additional stool specimen for ova and parasites.  2. Office visit in 8 weeks.  3. Regular diet.    Hedwig Morton. Juanda Chance, MD  Electronically Signed   DMB/MedQ  DD: 05/17/2006  DT: 05/17/2006  Job #: [id number]   cc:   Deirdre Peer. Polite, M.D.

## 2010-10-15 NOTE — Op Note (Signed)
[name], Barry Thomas                 ACCOUNT NO.:  [id number]   MEDICAL RECORD NO.:  [id number]          PATIENT TYPE:  INP   LOCATION:  1609                         FACILITY:  [facility]   PHYSICIAN:  Iva Boop, MD,FACGDATE OF BIRTH:  Feb 03, 1961   DATE OF PROCEDURE:  05/10/2006  DATE OF DISCHARGE:  05/11/2006                               OPERATIVE REPORT   GASTROENTEROLOGIST:  Iva Boop, MD,FACG.   PROCEDURE:  Small-bowel capsule endoscopy.   INDICATIONS:  This is a 50 year old man with several weeks of abdominal  pain, cramps, diarrhea and inflammatory changes in the small bowel on CT  and small bowel follow-through.  Etiology not clear.  EGD and  enteroscopy, including biopsies of the intestine were negative.   PROCEDURE DATA:  Gastric passage time 1 hour 25 minutes.  The capsule  reached the distal ileum, but not the colon.   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  There is a small jejunal ulcer.   RECOMMENDATIONS AND PLAN:  Stool for ova and parasites.  Treatment per  Dr. Juanda Chance.  I have discussed the results with Dr. Juanda Chance.  The patient  should watch for capsule passage.  I doubt an obstruction, based upon  the CT and small bowel follow-through findings, however.      Iva Boop, MD,FACG  Electronically Signed     CEG/MEDQ  D:  05/12/2006  T:  05/13/2006  Job:  [id number]   cc:   Hedwig Morton. Juanda Chance, MD

## 2010-10-15 NOTE — H&P (Signed)
NAMEPAPA, PIERCEFIELD                 ACCOUNT NO.:  [id number]   MEDICAL RECORD NO.:  [id number]          PATIENT TYPE:  EMS   LOCATION:  ED                           FACILITY:  [facility]   PHYSICIAN:  Deirdre Peer. Polite, M.D. DATE OF BIRTH:  01/02/1961   DATE OF ADMISSION:  05/06/2006  DATE OF DISCHARGE:                              HISTORY & PHYSICAL   CHIEF COMPLAINT:  Abdominal pain.   HISTORY OF PRESENT ILLNESS:  Mr. Hitchens is a 50 year old male with no  significant past medical history, comes to the ED with approximately 3  week history of crampy abdominal pain.  According to the patient, he was  in his usual state of health until approximately 9-10 days before  Thanksgiving where he experienced crampy abdominal pain with some  associated bouts of diarrhea.  Denied any blood in the diarrhea.  Shortly after Thanksgiving, symptoms abated.  However, 10 days later,  symptoms seemed to recur with crampy abdominal pain and loose stool.  The patient has been seen in the ED last Tuesday, been evaluated, was  found to have a mildly elevated white count, was given IV fluids,  analgesia and discharged.  He had a follow up ultrasound reportedly  within normal limits.  The patient returns to the ED Friday night with  same abdominal pains, cramps, and some associated diarrhea.  The patient  was febrile on admit, 99.1, BP 141/92, pulse 100, respiratory rate 16,  saturating 96%.  White count elevated at 12.  UA with an elevated  specific gravity suggesting component of dehydration.  BMET within  normal limits.  The patient underwent CT of his abdomen which showed  some thickening of the wall of the distal loops of small bowel with free  pelvic fluid compatible with infectious versus inflammatory or ischemic  enteritis.  It was also noted that there was no vascular disease;  therefore, making ischemic enteritis less likely.  Because of the  prolonged symptoms and abnormalities on CT, admission is  recommended for  further evaluation and treatment.  The patient does admit to some  chills, denies any fever, does have well water at home but consumes  essentially bottled water.  A few days prior to the initiation of this  event, the patient did eat some [place] food; however, his wife ate  [place] food as well, a different dish, but she has not had any  symptoms.  He denies any travel, denies any recent antibiotics.  Again,  as stated, admission is deemed necessary for further evaluation and  treatment.   PAST MEDICAL HISTORY:  As stated above.   MEDICATIONS:  The patient has been prescribed Percocet from the ED a few  days ago.   SOCIAL HISTORY:  Negative for tobacco, alcohol, or drugs.   PAST SURGICAL HISTORY:  None.   ALLERGIES:  Describes allergies to NONSTEROIDAL ANTI-INFLAMMATORY DRUGS  which cause swelling and stomach cramps.   FAMILY HISTORY:  Mother with status post pacer.  Father history of  melanoma, hypertension.   REVIEW OF SYSTEMS:  As stated in HPI.   PHYSICAL EXAMINATION:  GENERAL:  Alert and oriented x3.  No apparent  distress.  VITAL SIGNS:  T-max 99.1, T current 98.2.  BP 127/68, pulse 82,  respiratory rate 16.  HEENT:  Within normal limits.  CHEST:  Clear to auscultation bilaterally without rales or rhonchi.  CARDIOVASCULAR:  Regular.  No S3.  ABDOMEN:  Soft, nontender, no mass appreciated.  EXTREMITIES:  No cyanosis, clubbing, or edema.  NEUROLOGIC:  Nonfocal.   DATA:  As stated in the HPI.   ASSESSMENT:  1. Abdominal pain x3 weeks.  2. Abnormal CT showing thickening of the distal loops of the small      bowel.  Differential includes infectious versus inflammatory,      questionable possible early small bowel obstruction.  3. Leukocytosis.  Recommend that the patient be admitted to a medicine      floor bed.  The patient will be given IV fluids, empiric      antibiotics, placed on bowel rest.  Obtain a surgery consult      because of the CT  showing questionable early possible small bowel      obstruction plus/minus a gastroenterology consult for further      evaluation of potential inflammatory bowel ds.  The patient will      have followup labs and will have stool cultures.  We will make      further recommendations as deemed necessary.      Deirdre Peer. Polite, M.D.  Electronically Signed     RDP/MEDQ  D:  05/06/2006  T:  05/06/2006  Job:  [id number]

## 2010-12-07 ENCOUNTER — Encounter: Payer: Self-pay | Admitting: Internal Medicine

## 2010-12-23 ENCOUNTER — Ambulatory Visit (AMBULATORY_SURGERY_CENTER): Payer: 59 | Admitting: *Deleted

## 2010-12-23 ENCOUNTER — Encounter: Payer: Self-pay | Admitting: Internal Medicine

## 2010-12-23 VITALS — Ht 78.0 in | Wt 272.7 lb

## 2010-12-23 DIAGNOSIS — Z1211 Encounter for screening for malignant neoplasm of colon: Secondary | ICD-10-CM

## 2010-12-23 MED ORDER — PEG-KCL-NACL-NASULF-NA ASC-C 100 G PO SOLR: ORAL | Status: DC

## 2011-01-06 ENCOUNTER — Ambulatory Visit (AMBULATORY_SURGERY_CENTER): Payer: 59 | Admitting: Internal Medicine

## 2011-01-06 ENCOUNTER — Encounter: Payer: Self-pay | Admitting: Internal Medicine

## 2011-01-06 VITALS — BP 128/58 | HR 58 | Temp 98.4°F | Resp 13 | Ht 78.0 in | Wt 272.0 lb

## 2011-01-06 DIAGNOSIS — Z1211 Encounter for screening for malignant neoplasm of colon: Secondary | ICD-10-CM

## 2011-01-06 MED ORDER — SODIUM CHLORIDE 0.9 % IV SOLN: 500.0000 mL | INTRAVENOUS | Status: DC

## 2011-01-06 NOTE — Patient Instructions (Signed)
Eat a high fiber diet to prevent diverticulosis.   Resume your routine medications today.  Please return for your next colonoscopy in 10 years.   IF you have any questions or concerns, please call [phone]. Thank-you.

## 2011-01-07 ENCOUNTER — Telehealth: Payer: Self-pay

## 2011-01-07 NOTE — Telephone Encounter (Signed)

## 2012-03-13 ENCOUNTER — Ambulatory Visit (INDEPENDENT_AMBULATORY_CARE_PROVIDER_SITE_OTHER): Payer: 59 | Admitting: Family Medicine

## 2012-03-13 VITALS — BP 126/80 | HR 73 | Temp 97.9°F | Resp 18 | Ht 78.0 in | Wt 270.0 lb

## 2012-03-13 DIAGNOSIS — R5381 Other malaise: Secondary | ICD-10-CM

## 2012-03-13 DIAGNOSIS — R42 Dizziness and giddiness: Secondary | ICD-10-CM

## 2012-03-13 DIAGNOSIS — Z131 Encounter for screening for diabetes mellitus: Secondary | ICD-10-CM

## 2012-03-13 DIAGNOSIS — A09 Infectious gastroenteritis and colitis, unspecified: Secondary | ICD-10-CM

## 2012-03-13 DIAGNOSIS — R5383 Other fatigue: Secondary | ICD-10-CM

## 2012-03-13 LAB — COMPREHENSIVE METABOLIC PANEL
AST: 12 U/L (ref 0–37)
Alkaline Phosphatase: 70 U/L (ref 39–117)
BUN: 11 mg/dL (ref 6–23)
Creat: 1.2 mg/dL (ref 0.50–1.35)
Potassium: 4.7 mEq/L (ref 3.5–5.3)
Sodium: 139 mEq/L (ref 135–145)

## 2012-03-13 LAB — POCT UA - MICROSCOPIC ONLY
Bacteria, U Microscopic: NEGATIVE
Casts, Ur, LPF, POC: NEGATIVE
Crystals, Ur, HPF, POC: NEGATIVE
Epithelial cells, urine per micros: NEGATIVE
Mucus, UA: NEGATIVE
RBC, urine, microscopic: NEGATIVE
WBC, Ur, HPF, POC: NEGATIVE
Yeast, UA: NEGATIVE

## 2012-03-13 LAB — POCT CBC
Granulocyte percent: 77.2 % (ref 37–80)
HCT, POC: 48.4 % (ref 43.5–53.7)
Hemoglobin: 15.3 g/dL (ref 14.1–18.1)
Lymph, poc: 2 (ref 0.6–3.4)
MCH, POC: 28.1 pg (ref 27–31.2)
MCHC: 31.6 g/dL — AB (ref 31.8–35.4)
MCV: 88.9 fL (ref 80–97)
MID (cbc): 0.5 (ref 0–0.9)
MPV: 8.8 fL (ref 0–99.8)
POC Granulocyte: 8.7 — AB (ref 2–6.9)
POC LYMPH PERCENT: 18 % (ref 10–50)
POC MID %: 4.8 % (ref 0–12)
Platelet Count, POC: 366 10*3/uL (ref 142–424)
RBC: 5.44 M/uL (ref 4.69–6.13)
RDW, POC: 14.8 %
WBC: 11.3 10*3/uL — AB (ref 4.6–10.2)

## 2012-03-13 LAB — COMPREHENSIVE METABOLIC PANEL WITH GFR
ALT: 15 U/L (ref 0–53)
Albumin: 4.5 g/dL (ref 3.5–5.2)
CO2: 28 meq/L (ref 19–32)
Calcium: 10.3 mg/dL (ref 8.4–10.5)
Chloride: 103 meq/L (ref 96–112)
Glucose, Bld: 107 mg/dL — ABNORMAL HIGH (ref 70–99)
Total Bilirubin: 0.6 mg/dL (ref 0.3–1.2)
Total Protein: 7 g/dL (ref 6.0–8.3)

## 2012-03-13 LAB — POCT URINALYSIS DIPSTICK
Bilirubin, UA: NEGATIVE
Blood, UA: NEGATIVE
Glucose, UA: NEGATIVE
Ketones, UA: NEGATIVE
Leukocytes, UA: NEGATIVE
Nitrite, UA: NEGATIVE
Protein, UA: NEGATIVE
Spec Grav, UA: 1.02
Urobilinogen, UA: 0.2
pH, UA: 7.5

## 2012-03-13 LAB — POCT GLYCOSYLATED HEMOGLOBIN (HGB A1C): Hemoglobin A1C: 5.6

## 2012-03-13 LAB — TSH: TSH: 1.155 u[IU]/mL (ref 0.350–4.500)

## 2012-03-13 MED ORDER — CIPROFLOXACIN HCL 500 MG PO TABS: 500.0000 mg | ORAL_TABLET | Freq: Two times a day (BID) | ORAL | Status: DC

## 2012-03-13 NOTE — Progress Notes (Signed)
[facility]:  Office Visit  Chief Complaint:  Chief Complaint  Patient presents with  . lightheaded    feels lightheaded with head pounding and slightly nauseated feeling  . Fatigue    6 days    HPI: Barry Thomas is a 51 y.o. male who complains of  6 day h/o feeling light headed since Thursday. Has had recent GI bug with wife, has had nausea and nonbloody diarrhea on Friday  3 episodes, non since, and since Thursday feels tired. No URI sxs.   Slight bad taste in mouth. Denies diabetes. Has had nausea. He ate some left over beef stew for lunch and milk with cereal this am and has felt worse.   Past Medical History  Diagnosis Date  . Seasonal allergies   . Basal cell cancer 2008, 2010    leg and face   Past Surgical History  Procedure Date  . Removal basal cell 2008, 2010    face and leg   History   Social History  . Marital Status: Married    Spouse Name: N/A    Number of Children: N/A  . Years of Education: N/A   Social History Main Topics  . Smoking status: Never Smoker   . Smokeless tobacco: None  . Alcohol Use: No  . Drug Use: No  . Sexually Active: None   Other Topics Concern  . None   Social History Narrative  . None   Family History  Problem Relation Age of Onset  . Colon cancer Neg Hx   . Esophageal cancer Neg Hx   . Stomach cancer Neg Hx   . Psoriasis Mother   . Diabetes Father    Allergies  Allergen Reactions  . Aspirin Swelling    Stomach cramps  . Nsaids     REACTION: swelling and stomach cramps   Prior to Admission medications   Medication Sig Start Date End Date Taking? Authorizing Provider  acetaminophen (TYLENOL) 500 MG tablet Take 500 mg by mouth every 6 (six) hours as needed.     Yes Historical Provider, MD  cetirizine-pseudoephedrine (ZYRTEC-D) 5-120 MG per tablet Take 1 tablet by mouth as needed.     Yes Historical Provider, MD     ROS: The patient denies fevers, chills, night sweats, unintentional  weight loss, chest pain, palpitations, wheezing, dyspnea on exertion, vomiting, abdominal pain, dysuria, hematuria, melena, numbness, weakness, or tingling.   All other systems have been reviewed and were otherwise negative with the exception of those mentioned in the HPI and as above.    PHYSICAL EXAM: Filed Vitals:   03/13/12 1500  BP: 126/80  Pulse: 73  Temp:   Resp:    Filed Vitals:   03/13/12 1430  Height: 6\' 6"  (1.981 m)  Weight: 270 lb (122.471 kg)   Body mass index is 31.20 kg/(m^2).  General: Alert, no acute distress HEENT:  Normocephalic, atraumatic, oropharynx patent.  Cardiovascular:  Regular rate and rhythm, no rubs murmurs or gallops.  No Carotid bruits, radial pulse intact. No pedal edema.  Respiratory: Clear to auscultation bilaterally.  No wheezes, rales, or rhonchi.  No cyanosis, no use of accessory musculature GI: No organomegaly, abdomen is soft and non-tender, positive bowel sounds.  No masses. Skin: No rashes. Neurologic: Facial musculature symmetric. Psychiatric: Patient is appropriate throughout our interaction. Lymphatic: No cervical lymphadenopathy Musculoskeletal: Gait intact.   LABS: Results for orders placed in visit on 03/13/12  POCT CBC      Component  Value Range   WBC 11.3 (*) 4.6 - 10.2 K/uL   Lymph, poc 2.0  0.6 - 3.4   POC LYMPH PERCENT 18.0  10 - 50 %L   MID (cbc) 0.5  0 - 0.9   POC MID % 4.8  0 - 12 %M   POC Granulocyte 8.7 (*) 2 - 6.9   Granulocyte percent 77.2  37 - 80 %G   RBC 5.44  4.69 - 6.13 M/uL   Hemoglobin 15.3  14.1 - 18.1 g/dL   HCT, POC 16.1  09.6 - 53.7 %   MCV 88.9  80 - 97 fL   MCH, POC 28.1  27 - 31.2 pg   MCHC 31.6 (*) 31.8 - 35.4 g/dL   RDW, POC 04.5     Platelet Count, POC 366  142 - 424 K/uL   MPV 8.8  0 - 99.8 fL  POCT URINALYSIS DIPSTICK      Component Value Range   Color, UA yellow     Clarity, UA clear     Glucose, UA neg     Bilirubin, UA neg     Ketones, UA neg     Spec Grav, UA 1.020     Blood,  UA neg     pH, UA 7.5     Protein, UA neg     Urobilinogen, UA 0.2     Nitrite, UA neg     Leukocytes, UA Negative    POCT UA - MICROSCOPIC ONLY      Component Value Range   WBC, Ur, HPF, POC neg     RBC, urine, microscopic neg     Bacteria, U Microscopic neg     Mucus, UA neg     Epithelial cells, urine per micros neg     Crystals, Ur, HPF, POC neg     Casts, Ur, LPF, POC neg     Yeast, UA neg    POCT GLYCOSYLATED HEMOGLOBIN (HGB A1C)      Component Value Range   Hemoglobin A1C 5.6       EKG/XRAY:   Primary read interpreted by Dr. Conley Rolls at [facility].   ASSESSMENT/PLAN: Encounter Diagnoses  Name Primary?  . Light-headed feeling Yes  . Fatigue   . Screening for diabetes mellitus   . Gastroenteritis, infectious    Presumptive treatment for infectious gastroenteritis Rx Cipro 500 mg PO BID x 10 days BRAT diet, advance as tolerated. Push fluids    Imo Cumbie PHUONG, DO 03/13/2012 3:30 PM

## 2012-03-30 ENCOUNTER — Encounter: Payer: Self-pay | Admitting: Family Medicine

## 2012-07-05 ENCOUNTER — Ambulatory Visit (INDEPENDENT_AMBULATORY_CARE_PROVIDER_SITE_OTHER): Payer: BC Managed Care – PPO | Admitting: Emergency Medicine

## 2012-07-05 VITALS — BP 144/73 | HR 75 | Temp 98.5°F | Resp 16 | Ht 78.0 in | Wt 270.0 lb

## 2012-07-05 DIAGNOSIS — R52 Pain, unspecified: Secondary | ICD-10-CM

## 2012-07-05 DIAGNOSIS — R51 Headache: Secondary | ICD-10-CM

## 2012-07-05 DIAGNOSIS — R6883 Chills (without fever): Secondary | ICD-10-CM

## 2012-07-05 LAB — POCT CBC
HCT, POC: 46.7 % (ref 43.5–53.7)
Hemoglobin: 15.7 g/dL (ref 14.1–18.1)
Lymph, poc: 2.1 (ref 0.6–3.4)
MCH, POC: 29.1 pg (ref 27–31.2)
MCHC: 33.6 g/dL (ref 31.8–35.4)
MCV: 86.6 fL (ref 80–97)
MID (cbc): 0.4 (ref 0–0.9)
POC Granulocyte: 6.1 (ref 2–6.9)
POC MID %: 4.7 %M (ref 0–12)
WBC: 8.6 10*3/uL (ref 4.6–10.2)

## 2012-07-05 NOTE — Progress Notes (Signed)
  Subjective:    Patient ID: Barry Thomas, male    DOB: [birth year], 52 y.o.   MRN: [id number]  HPI Pt presents to clinic today symptoms that started Tuesday. Headache, possible skipped beats, chills, weakness, and body aches. Pt did not have a flu shot this year. Denies any SOB.  Pt has an upcoming cruise planned and is looking forward to going.    Review of Systems     Objective:   Physical Exam H. EENT exam is unremarkable. Neck supple. Chest is clear to auscultation and percussion. Heart regular rate without murmurs rubs or gallops. Abdomen is flat liver and spleen not enlarged there no areas of tenderness and no masses are felt . EKG normal sinus rhythem Results for orders placed in visit on 07/05/12  POCT CBC      Component Value Range   WBC 8.6  4.6 - 10.2 K/uL   Lymph, poc 2.1  0.6 - 3.4   POC LYMPH PERCENT 24.8  10 - 50 %L   MID (cbc) 0.4  0 - 0.9   POC MID % 4.7  0 - 12 %M   POC Granulocyte 6.1  2 - 6.9   Granulocyte percent 70.5  37 - 80 %G   RBC 5.39  4.69 - 6.13 M/uL   Hemoglobin 15.7  14.1 - 18.1 g/dL   HCT, POC 78.4  69.6 - 53.7 %   MCV 86.6  80 - 97 fL   MCH, POC 29.1  27 - 31.2 pg   MCHC 33.6  31.8 - 35.4 g/dL   RDW, POC 29.5     Platelet Count, POC 317  142 - 424 K/uL   MPV 8.8  0 - 99.8 fL  GLUCOSE, POCT (MANUAL RESULT ENTRY)      Component Value Range   POC Glucose 94  70 - 99 mg/dl        Assessment & Plan:  Continue to drink fluids Tylenol for her headache discomfort recheck if other symptoms develop .

## 2012-11-13 ENCOUNTER — Ambulatory Visit (INDEPENDENT_AMBULATORY_CARE_PROVIDER_SITE_OTHER): Payer: BC Managed Care – PPO | Admitting: Internal Medicine

## 2012-11-13 VITALS — BP 117/69 | HR 91 | Temp 98.0°F | Resp 18 | Ht 78.0 in | Wt 270.0 lb

## 2012-11-13 DIAGNOSIS — M79604 Pain in right leg: Secondary | ICD-10-CM

## 2012-11-13 DIAGNOSIS — L03119 Cellulitis of unspecified part of limb: Secondary | ICD-10-CM

## 2012-11-13 DIAGNOSIS — L03115 Cellulitis of right lower limb: Secondary | ICD-10-CM

## 2012-11-13 DIAGNOSIS — M79609 Pain in unspecified limb: Secondary | ICD-10-CM

## 2012-11-13 DIAGNOSIS — L989 Disorder of the skin and subcutaneous tissue, unspecified: Secondary | ICD-10-CM

## 2012-11-13 MED ORDER — MUPIROCIN 2 % EX OINT: TOPICAL_OINTMENT | Freq: Three times a day (TID) | CUTANEOUS | Status: DC

## 2012-11-13 MED ORDER — DOXYCYCLINE HYCLATE 100 MG PO TABS: 100.0000 mg | ORAL_TABLET | Freq: Two times a day (BID) | ORAL | Status: DC

## 2012-11-13 NOTE — Progress Notes (Signed)
  Subjective:    Patient ID: Barry Thomas, male    DOB: March 17, 1961, 52 y.o.   MRN: [id number]  HPI    Review of Systems     Objective:   Physical Exam        Assessment & Plan:

## 2012-11-13 NOTE — Progress Notes (Signed)
  Subjective:    Patient ID: Barry Thomas, male    DOB: 04/11/1961, 52 y.o.   MRN: [id number]  HPI  52 year old male c/o: TD 60yrs Insect bite x 2 days, right  popliteal,  progressing in size, itchy, oozing, redness around area.  Has used neosporin and hydrocortisone.  Blister burst last night.  No fever, mild pain, no function loss.. Review of Systems See chart    Objective:   Physical Exam  Constitutional: He is oriented to person, place, and time. He appears well-developed and well-nourished.  Eyes: EOM are normal. No scleral icterus.  Neck: Neck supple.  Cardiovascular: Normal rate.   Pulmonary/Chest: Effort normal.  Musculoskeletal: Normal range of motion. He exhibits no edema and no tenderness.  Neurological: He is alert and oriented to person, place, and time. No cranial nerve deficit. He exhibits normal muscle tone. Coordination normal.  Skin: Lesion and rash noted. Rash is vesicular. There is erythema.     Psychiatric: He has a normal mood and affect.  central 1cm blister, open  CS done        Assessment & Plan:  Mupirocin/soap and water/Doxycycline

## 2012-11-13 NOTE — Patient Instructions (Addendum)
Cellulitis Cellulitis is an infection of the skin and the tissue beneath it. The infected area is usually red and tender. Cellulitis occurs most often in the arms and lower legs.  CAUSES  Cellulitis is caused by bacteria that enter the skin through cracks or cuts in the skin. The most common types of bacteria that cause cellulitis are Staphylococcus and Streptococcus. SYMPTOMS   Redness and warmth.  Swelling.  Tenderness or pain.  Fever. DIAGNOSIS  Your caregiver can usually determine what is wrong based on a physical exam. Blood tests may also be done. TREATMENT  Treatment usually involves taking an antibiotic medicine. HOME CARE INSTRUCTIONS   Take your antibiotics as directed. Finish them even if you start to feel better.  Keep the infected arm or leg elevated to reduce swelling.  Apply a warm cloth to the affected area up to 4 times per day to relieve pain.  Only take over-the-counter or prescription medicines for pain, discomfort, or fever as directed by your caregiver.  Keep all follow-up appointments as directed by your caregiver. SEEK MEDICAL CARE IF:   You notice red streaks coming from the infected area.  Your red area gets larger or turns dark in color.  Your bone or joint underneath the infected area becomes painful after the skin has healed.  Your infection returns in the same area or another area.  You notice a swollen bump in the infected area.  You develop new symptoms. SEEK IMMEDIATE MEDICAL CARE IF:   You have a fever.  You feel very sleepy.  You develop vomiting or diarrhea.  You have a general ill feeling (malaise) with muscle aches and pains. MAKE SURE YOU:   Understand these instructions.  Will watch your condition.  Will get help right away if you are not doing well or get worse. Document Released: 02/23/2005 Document Revised: 11/15/2011 Document Reviewed: 08/01/2011 Exit[employer] Patient Information 2014 [employer].  

## 2012-11-16 LAB — WOUND CULTURE
Gram Stain: NONE SEEN
Gram Stain: NONE SEEN
Organism ID, Bacteria: NO GROWTH

## 2013-02-11 ENCOUNTER — Other Ambulatory Visit (INDEPENDENT_AMBULATORY_CARE_PROVIDER_SITE_OTHER): Payer: BC Managed Care – PPO

## 2013-02-11 ENCOUNTER — Telehealth: Payer: Self-pay | Admitting: Internal Medicine

## 2013-02-11 ENCOUNTER — Ambulatory Visit (INDEPENDENT_AMBULATORY_CARE_PROVIDER_SITE_OTHER): Payer: BC Managed Care – PPO | Admitting: Gastroenterology

## 2013-02-11 ENCOUNTER — Encounter: Payer: Self-pay | Admitting: Gastroenterology

## 2013-02-11 VITALS — BP 138/66 | HR 70 | Ht 78.0 in | Wt 269.0 lb

## 2013-02-11 DIAGNOSIS — R109 Unspecified abdominal pain: Secondary | ICD-10-CM

## 2013-02-11 LAB — CBC WITH DIFFERENTIAL/PLATELET
Basophils Absolute: 0.1 10*3/uL (ref 0.0–0.1)
Basophils Relative: 0.6 % (ref 0.0–3.0)
Eosinophils Relative: 2.4 % (ref 0.0–5.0)
HCT: 43.7 % (ref 39.0–52.0)
Hemoglobin: 14.9 g/dL (ref 13.0–17.0)
Lymphocytes Relative: 24.2 % (ref 12.0–46.0)
Lymphs Abs: 2.5 10*3/uL (ref 0.7–4.0)
Monocytes Relative: 5.6 % (ref 3.0–12.0)
Neutro Abs: 7 10*3/uL (ref 1.4–7.7)
RBC: 5.13 Mil/uL (ref 4.22–5.81)
RDW: 14.1 % (ref 11.5–14.6)
WBC: 10.4 10*3/uL (ref 4.5–10.5)

## 2013-02-11 LAB — BASIC METABOLIC PANEL
Calcium: 9.2 mg/dL (ref 8.4–10.5)
GFR: 93.47 mL/min (ref >60.00)
Glucose, Bld: 128 mg/dL — ABNORMAL HIGH (ref 70–99)
Potassium: 3.7 mEq/L (ref 3.5–5.1)
Sodium: 136 mEq/L (ref 135–145)

## 2013-02-11 MED ORDER — DICYCLOMINE HCL 20 MG PO TABS: 20.0000 mg | ORAL_TABLET | Freq: Every day | ORAL | Status: DC

## 2013-02-11 NOTE — Progress Notes (Signed)
Reviewed and agree with Labs, Bentyl, CT scan

## 2013-02-11 NOTE — Progress Notes (Signed)
02/11/2013 Barry Thomas [id number] [birth year]   History of Present Illness:  Patient is a pleasant 52 year old male who is a patient of Dr. Regino Schultze.  He presents to our office today with complaints of lower abdominal pain that has been intermittent since last Wednesday.  Pain comes and goes, reaching intensity of of 3-4/10 and lasting approximately 5 minutes at a time.  It is described as a cramping pain, but becomes sharp at times.  It moves around in his lower abdomen and has been waking him from sleep in the middle of the night.  He absolutely denies any other GI complaints, including diarrhea and changes in bowels.  No urinary symptoms.  He had a normal colonoscopy in 12/2010.  He is concerned because he had a remote history of a parasite infection in 2007.  He thinks that he had acquired that infection from a restaurant where they had eaten, however.  He says that this pain feels different and he is not having diarrhea like he was with the previous parasite infection, but still just gets nervous when he has intestinal symptoms.   Current Medications, Allergies, Past Medical History, Past Surgical History, Family History and Social History were reviewed in [employer] record.   Physical Exam: BP 138/66  Pulse 70  Ht 6\' 6"  (1.981 m)  Wt 269 lb (122.018 kg)  BMI 31.09 kg/m2 General: Well developed, white male in no acute distress Head: Normocephalic and atraumatic Eyes:  sclerae anicteric, conjunctiva pink  Ears: Normal auditory acuity Lungs: Clear throughout to auscultation Heart: Regular rate and rhythm Abdomen: Soft, non-tender and non-distended. No masses, no hepatomegaly. Normal bowel sounds. Musculoskeletal: Symmetrical with no gross deformities  Extremities: No edema  Neurological: Alert oriented x 4, grossly nonfocal Psychological:  Alert and cooperative. Normal mood and affect  Assessment and Recommendations: -Abdominal pain:  Unsure of the cause of  his pain since it is occurring without any other complaints, but is concerning that it wakes him from sleep at night.  Has distant history of intestinal parasite but in the absence of diarrhea, I think that it is unlikely that he has an infectious cause.  Will check CBC and BMP.  Will also check CT scan of the abdomen and pelvis with contrast.  Will try bentyl 20 mg at bedtime in the interim to see if this helps to prevent him from waking up with pain at night.  Follow-up with Dr. Juanda Chance in approximately 3 weeks.  I spoke with Dr. Rhea Belton regarding this case as well.

## 2013-02-11 NOTE — Patient Instructions (Addendum)
Your physician has requested that you go to the basement for the following lab work before leaving today:  CBC, BMET  We have sent the following medications to your pharmacy for you to pick up at your convenience:  Bentyl    You have been scheduled for a CT scan of the abdomen and pelvis at [facility] ([address]---this is in the same building as [facility]).   You are scheduled on 02/13/2013 at 10:00am. You should arrive 15 minutes prior to your appointment time for registration. Please follow the written instructions below on the day of your exam:  WARNING: IF YOU ARE ALLERGIC TO IODINE/X-RAY DYE, PLEASE NOTIFY RADIOLOGY IMMEDIATELY AT [phone]! YOU WILL BE GIVEN A 13 HOUR PREMEDICATION PREP.  1) Do not eat or drink anything after 6:00am (4 hours prior to your test) 2) You have been given 2 bottles of oral contrast to drink. The solution may taste better if refrigerated, but do NOT add ice or any other liquid to this solution. Shake  well before drinking.    Drink 1 bottle of contrast @ 8:00am (2 hours prior to your exam)  Drink 1 bottle of contrast @ 9:00am (1 hour prior to your exam)  You may take any medications as prescribed with a small amount of water except for the following: Metformin, Glucophage, Glucovance, Avandamet, Riomet, Fortamet, Actoplus Met, Janumet, Glumetza or Metaglip. The above medications must be held the day of the exam AND 48 hours after the exam.  The purpose of you drinking the oral contrast is to aid in the visualization of your intestinal tract. The contrast solution may cause some diarrhea. Before your exam is started, you will be given a small amount of fluid to drink. Depending on your individual set of symptoms, you may also receive an intravenous injection of x-ray contrast/dye. Plan on being at [facility] for 30 minutes or long, depending on the type of exam you are having performed.    If you have any questions  regarding your exam or if you need to reschedule, you may call the CT department at [phone] between the hours of 8:00 am and 5:00 pm, Monday-Friday.  ________________________________________________________________________

## 2013-02-11 NOTE — Telephone Encounter (Signed)
Spoke with patient and he is waking up at night with abdominal pain. Started last Wednesday and happened again on Friday and Sunday. The pain is moving around. It was in upper abdomen on Wednesday and lower abdomen later on. Denies diarrhea, constipation, nausea or vomiting. He states he does have a history of parasites and is concerned about this. Scheduled with Doug Sou, PA today at 2:00 PM.

## 2013-02-13 ENCOUNTER — Ambulatory Visit (INDEPENDENT_AMBULATORY_CARE_PROVIDER_SITE_OTHER)
Admission: RE | Admit: 2013-02-13 | Discharge: 2013-02-13 | Disposition: A | Discharge status: Home / Self Care | Payer: BC Managed Care – PPO | Source: Ambulatory Visit | Attending: Gastroenterology | Admitting: Gastroenterology

## 2013-02-13 DIAGNOSIS — R109 Unspecified abdominal pain: Secondary | ICD-10-CM

## 2013-02-13 MED ORDER — IOHEXOL 300 MG/ML  SOLN
100.0000 mL | Freq: Once | INTRAMUSCULAR | Status: AC | PRN
  Administered 2013-02-13: 100 mL via INTRAVENOUS

## 2013-06-22 ENCOUNTER — Ambulatory Visit (INDEPENDENT_AMBULATORY_CARE_PROVIDER_SITE_OTHER): Payer: BC Managed Care – PPO | Admitting: Family Medicine

## 2013-06-22 ENCOUNTER — Other Ambulatory Visit: Payer: Self-pay

## 2013-06-22 VITALS — BP 140/62 | HR 98 | Temp 98.8°F | Resp 24 | Ht 78.0 in | Wt 271.0 lb

## 2013-06-22 DIAGNOSIS — J209 Acute bronchitis, unspecified: Secondary | ICD-10-CM

## 2013-06-22 DIAGNOSIS — J329 Chronic sinusitis, unspecified: Secondary | ICD-10-CM

## 2013-06-22 MED ORDER — HYDROCODONE-HOMATROPINE 5-1.5 MG/5ML PO SYRP: 5.0000 mL | ORAL_SOLUTION | ORAL | Status: DC | PRN

## 2013-06-22 MED ORDER — CEFDINIR 300 MG PO CAPS: 600.0000 mg | ORAL_CAPSULE | Freq: Every day | ORAL | Status: DC

## 2013-06-22 NOTE — Progress Notes (Signed)
Subjective: 53 year old man who has been having a respiratory tract infection for the past week or 8 days. He had a tickle in the back of his nose then developed more congestion and coughing. He is blowing out purulent and bloody mucus from his nose. He is coughing a special on them morning. He's been taking OTC medications including Zyrtec D and then Mucinex D and then a suppressant. He does not smoke. Rarely he is a healthy man.  Objective: Pleasant alert gentleman in no major acute distress. His TMs are normal. Nose congested. Throat without erythema. Neck supple without significant nodes. Chest is clear to auscultation. Heart regular without murmurs.  Assessment: Sinusitis/bronchitis, status post viral upper respiratory infection  Plan: Hycodan Omnicef Return as needed

## 2013-06-22 NOTE — Patient Instructions (Signed)
Drink plenty of fluids and try to get enough rest  Take cough syrup every 4-6 hours if needed for coughing  Take the Omnicef antibiotic one twice daily for infection  He can continue using decongestants and Mucinex as needed.  Return if worse

## 2013-10-07 ENCOUNTER — Ambulatory Visit (INDEPENDENT_AMBULATORY_CARE_PROVIDER_SITE_OTHER): Payer: BC Managed Care – PPO | Admitting: Internal Medicine

## 2013-10-07 VITALS — BP 130/80 | HR 72 | Temp 99.0°F | Resp 16 | Ht 77.25 in | Wt 269.0 lb

## 2013-10-07 DIAGNOSIS — T148 Other injury of unspecified body region: Secondary | ICD-10-CM

## 2013-10-07 DIAGNOSIS — W57XXXA Bitten or stung by nonvenomous insect and other nonvenomous arthropods, initial encounter: Secondary | ICD-10-CM

## 2013-10-07 NOTE — Patient Instructions (Signed)
Tick Bite Information Ticks are insects that attach themselves to the skin and draw blood for food. There are various types of ticks. Common types include wood ticks and deer ticks. Most ticks live in shrubs and grassy areas. Ticks can climb onto your body when you make contact with leaves or grass where the tick is waiting. The most common places on the body for ticks to attach themselves are the scalp, neck, armpits, waist, and groin. Most tick bites are harmless, but sometimes ticks carry germs that cause diseases. These germs can be spread to a person during the tick's feeding process. The chance of a disease spreading through a tick bite depends on:   The type of tick.  Time of year.   How long the tick is attached.   Geographic location.  HOW CAN YOU PREVENT TICK BITES? Take these steps to help prevent tick bites when you are outdoors:  Wear protective clothing. Long sleeves and long pants are best.   Wear white clothes so you can see ticks more easily.  Tuck your pant legs into your socks.   If walking on a trail, stay in the middle of the trail to avoid brushing against bushes.  Avoid walking through areas with long grass.  Put insect repellent on all exposed skin and along boot tops, pant legs, and sleeve cuffs.   Check clothing, hair, and skin repeatedly and before going inside.   Brush off any ticks that are not attached.  Take a shower or bath as soon as possible after being outdoors.  WHAT IS THE PROPER WAY TO REMOVE A TICK? Ticks should be removed as soon as possible to help prevent diseases caused by tick bites. 1. If latex gloves are available, put them on before trying to remove a tick.  2. Using fine-point tweezers, grasp the tick as close to the skin as possible. You may also use curved forceps or a tick removal tool. Grasp the tick as close to its head as possible. Avoid grasping the tick on its body. 3. Pull gently with steady upward pressure until  the tick lets go. Do not twist the tick or jerk it suddenly. This may break off the tick's head or mouth parts. 4. Do not squeeze or crush the tick's body. This could force disease-carrying fluids from the tick into your body.  5. After the tick is removed, wash the bite area and your hands with soap and water or other disinfectant such as alcohol. 6. Apply a small amount of antiseptic cream or ointment to the bite site.  7. Wash and disinfect any instruments that were used.  Do not try to remove a tick by applying a hot match, petroleum jelly, or fingernail polish to the tick. These methods do not work and may increase the chances of disease being spread from the tick bite.  WHEN SHOULD YOU SEEK MEDICAL CARE? Contact your health care provider if you are unable to remove a tick from your skin or if a part of the tick breaks off and is stuck in the skin.  After a tick bite, you need to be aware of signs and symptoms that could be related to diseases spread by ticks. Contact your health care provider if you develop any of the following in the days or weeks after the tick bite:  Unexplained fever.  Rash. A circular rash that appears days or weeks after the tick bite may indicate the possibility of Lyme disease. The rash may resemble   a target with a bull's-eye and may occur at a different part of your body than the tick bite.  Redness and swelling in the area of the tick bite.   Tender, swollen lymph glands.   Diarrhea.   Weight loss.   Cough.   Fatigue.   Muscle, joint, or bone pain.   Abdominal pain.   Headache.   Lethargy or a change in your level of consciousness.  Difficulty walking or moving your legs.   Numbness in the legs.   Paralysis.  Shortness of breath.   Confusion.   Repeated vomiting.  Document Released: 05/13/2000 Document Revised: 03/06/2013 Document Reviewed: 10/24/2012 [employer] Patient Information 2014 [employer].  

## 2013-10-07 NOTE — Progress Notes (Signed)
   Subjective:    Patient ID: Barry Thomas, male    DOB: 1960/07/23, 53 y.o.   MRN: [id number]  HPI Patient presents today complaining of retained tick material on his back. He was working in his yard and fishing 3 and 5 days ago. Noticed a tick embedded in his right flank area last night, attempted to remove the tick, but was unable to remove all of it. He brings in the partial tick today. The tick was not engorged.  Review of Systems No fever, no rash, no nausea, no vomiting.    Objective:   Physical Exam This is a very pleasant male in NAD. Right, mid flank with small area erythema and small amount of retained tick material.  The area was cleaned with alcohol and the remainder of the tick was removed with tweezers. There is no drainage, no streaking, no warmth.     Assessment & Plan:  1. Tick bite Provided written and oral instructions regarding tick bites. Patient to return if fever, rash, nausea/vomiting, myalgias.   Elby Beck, FNP-BC  [facility]  10/07/2013 9:03 AM

## 2013-12-06 ENCOUNTER — Ambulatory Visit (INDEPENDENT_AMBULATORY_CARE_PROVIDER_SITE_OTHER): Payer: BC Managed Care – PPO | Admitting: Family Medicine

## 2013-12-06 VITALS — BP 124/80 | HR 69 | Temp 98.8°F | Resp 18 | Ht 79.0 in | Wt 266.0 lb

## 2013-12-06 DIAGNOSIS — R059 Cough, unspecified: Secondary | ICD-10-CM

## 2013-12-06 DIAGNOSIS — J0100 Acute maxillary sinusitis, unspecified: Secondary | ICD-10-CM

## 2013-12-06 DIAGNOSIS — R05 Cough: Secondary | ICD-10-CM

## 2013-12-06 DIAGNOSIS — J209 Acute bronchitis, unspecified: Secondary | ICD-10-CM

## 2013-12-06 DIAGNOSIS — F4321 Adjustment disorder with depressed mood: Secondary | ICD-10-CM

## 2013-12-06 DIAGNOSIS — G479 Sleep disorder, unspecified: Secondary | ICD-10-CM

## 2013-12-06 MED ORDER — ALBUTEROL SULFATE HFA 108 (90 BASE) MCG/ACT IN AERS
2.0000 | INHALATION_SPRAY | RESPIRATORY_TRACT | Status: DC | PRN
Start: 2013-12-06 — End: 2016-02-05

## 2013-12-06 MED ORDER — HYDROCODONE-HOMATROPINE 5-1.5 MG/5ML PO SYRP: 5.0000 mL | ORAL_SOLUTION | ORAL | Status: DC | PRN

## 2013-12-06 MED ORDER — AMOXICILLIN 875 MG PO TABS: 875.0000 mg | ORAL_TABLET | Freq: Two times a day (BID) | ORAL | Status: DC

## 2013-12-06 NOTE — Patient Instructions (Addendum)
Use 2 inhalations every 4-6 hours  Take amoxicillin 875 one twice daily for infection  Return if worse or not improving  Hycodan 1 teaspoon 4 times daily as needed for cough.

## 2013-12-06 NOTE — Progress Notes (Signed)
Subjective: 53 year old man  who is here with several things. His mother died of days ago. He's not been sleeping well. Goal for him, he thinks it may be just as much from the cough is from the grief. He's had a cold for 11 days with coughing. That keeps him and his wife weight. He does not smoke. He has tried various OTC medications. He thinks he may have had a little bit of fever at times. Now he is blowing some purulent stuff from his nose and getting crusting in there. He coughs up some stress earlier in the day. His chest feels tight.  Objective: Large man in no acute distress. His TMs are normal. Throat clear. He says it has hurt. His neck supple without significant nodes. Chest clear to auscultation except on index brace she has little wheeze  Assessment: Otitis with end expiratory wheeze Maxillary sinusitis Grief secondary to loss of mother  Plan: Amoxicillin Albuterol inhaler Hycodan

## 2014-01-14 ENCOUNTER — Ambulatory Visit (INDEPENDENT_AMBULATORY_CARE_PROVIDER_SITE_OTHER): Payer: BC Managed Care – PPO | Admitting: Emergency Medicine

## 2014-01-14 VITALS — BP 122/70 | HR 73 | Temp 98.1°F | Resp 16 | Ht 78.0 in | Wt 268.0 lb

## 2014-01-14 DIAGNOSIS — T6391XA Toxic effect of contact with unspecified venomous animal, accidental (unintentional), initial encounter: Secondary | ICD-10-CM

## 2014-01-14 DIAGNOSIS — T63301A Toxic effect of unspecified spider venom, accidental (unintentional), initial encounter: Secondary | ICD-10-CM

## 2014-01-14 DIAGNOSIS — T63391A Toxic effect of venom of other spider, accidental (unintentional), initial encounter: Secondary | ICD-10-CM

## 2014-01-14 MED ORDER — DOXYCYCLINE HYCLATE 100 MG PO TABS: 100.0000 mg | ORAL_TABLET | Freq: Two times a day (BID) | ORAL | Status: DC

## 2014-01-14 MED ORDER — PREDNISONE 10 MG PO TABS: ORAL_TABLET | ORAL | Status: DC

## 2014-01-14 MED ORDER — EPINEPHRINE 0.3 MG/0.3ML IJ SOAJ: 0.3000 mg | Freq: Once | INTRAMUSCULAR | Status: DC

## 2014-01-14 NOTE — Progress Notes (Signed)
Subjective:  This chart was scribed for Barry Queen, MD by [name], Medical Scribe. This patient was seen in Room 12 and the patient's care was started at 8:39 AM.   Patient ID: Barry Thomas, male    DOB: 1960-11-15, 53 y.o.   MRN: [id number]  HPI HPI Comments: Barry Thomas is a 53 y.o. male who presents to the [facility] complaining of itchy, erythematous insect bites on his right thigh that he noticed yesterday.  He was working in his yards on Saturday and working in the woods on Sunday.  He has not slept in any hotel beds lately.  He has gotten bug bites in the past but never experienced symptoms this severe.  He has never grown MRSA from his bug bites in the past.  He takes Zyrtec as needed.  He does not have any medical problems.   Past Medical History  Diagnosis Date  . Seasonal allergies   . Basal cell cancer 2008, 2010    leg and face  . Pancreatitis    Past Surgical History  Procedure Laterality Date  . Removal basal cell  2008, 2010    face and leg   Family History  Problem Relation Age of Onset  . Colon cancer Neg Hx   . Esophageal cancer Neg Hx   . Stomach cancer Neg Hx   . Psoriasis Mother   . Hypertension Mother   . Atrial fibrillation Mother   . Diabetes Father   . Cancer Father   . Hypertension Father   . Cancer Sister   . Melanoma Sister    History   Social History  . Marital Status: Married    Spouse Name: N/A    Number of Children: 0  . Years of Education: N/A   Occupational History  . CPA CONTROLLER    Social History Main Topics  . Smoking status: Never Smoker   . Smokeless tobacco: Never Used  . Alcohol Use: No  . Drug Use: No  . Sexual Activity: Yes    Birth Control/ Protection: Condom   Other Topics Concern  . Not on file   Social History Narrative  . No narrative on file   Allergies  Allergen Reactions  . Aspirin Swelling    Stomach cramps  . Nsaids     REACTION: swelling and stomach cramps      Review of Systems  Skin: Positive for color change and wound. Negative for rash.     Objective:  Physical Exam  Nursing note and vitals reviewed. Constitutional: He is oriented to person, place, and time. He appears well-developed and well-nourished.  HENT:  Head: Normocephalic and atraumatic.  Eyes: EOM are normal.  Neck: Normal range of motion.  Cardiovascular: Normal rate.   Pulmonary/Chest: Effort normal.  Musculoskeletal: Normal range of motion.  Neurological: He is alert and oriented to person, place, and time.  Skin: Skin is warm and dry.  The proximal right anterior thigh lesion is 3.5x3.5cm with central blister and ulceration.  The medial insect bite is 5x4in with central blister and ulceration.  The distal lesion, medial right calf is 0.5x0.5in with central blister and ulceration.  The proximal lesion, proximal leg is 0.5x0.5 in and 1x1in with central blister and ulceration.  Psychiatric: He has a normal mood and affect. His behavior is normal.     BP 122/70  Pulse 73  Temp(Src) 98.1 F (36.7 C) (Oral)  Resp 16  Ht 6\' 6"  (1.981  m)  Wt 268 lb (121.564 kg)  BMI 30.98 kg/m2  SpO2 96% Assessment & Plan:  These appear to be envenomations. I'm not sure of what type of insect. We'll cover with antibiotics and prednisone soap water cleaning and he was given an EpiPen to protect him from severe reactions to  I personally performed the services described in this documentation, which was scribed in my presence. The recorded information has been reviewed and is accurate.

## 2014-01-14 NOTE — Patient Instructions (Signed)

## 2014-01-16 ENCOUNTER — Ambulatory Visit (INDEPENDENT_AMBULATORY_CARE_PROVIDER_SITE_OTHER): Payer: BC Managed Care – PPO | Admitting: Emergency Medicine

## 2014-01-16 ENCOUNTER — Encounter: Payer: Self-pay | Admitting: Emergency Medicine

## 2014-01-16 VITALS — BP 140/70 | HR 74 | Temp 98.4°F | Resp 18 | Ht 78.0 in | Wt 269.0 lb

## 2014-01-16 DIAGNOSIS — T63301D Toxic effect of unspecified spider venom, accidental (unintentional), subsequent encounter: Secondary | ICD-10-CM

## 2014-01-16 DIAGNOSIS — Z5189 Encounter for other specified aftercare: Secondary | ICD-10-CM

## 2014-01-16 DIAGNOSIS — L989 Disorder of the skin and subcutaneous tissue, unspecified: Secondary | ICD-10-CM

## 2014-01-16 LAB — WOUND CULTURE
Gram Stain: NONE SEEN
Gram Stain: NONE SEEN
Gram Stain: NONE SEEN
Organism ID, Bacteria: NO GROWTH

## 2014-01-16 NOTE — Progress Notes (Signed)
   Subjective:    Patient ID: Barry Thomas, male    DOB: December 01, 1960, 53 y.o.   MRN: [id number]  HPI patient here to followup infected insect bites of his right leg. There was concern over whether this was MRSA. They have the appearance of spider bites with significant induration erythema and blister formation with necrosis. He was traveling in the woods prior to this occurring    Review of Systems     Objective:   Physical Exam the lesions on his leg are significantly better. The redness is significantly better and the induration is last the blisters are healing        Assessment & Plan:  Culture for MRSA is negative. Lesions are improved on prednisone and doxycycline. No change in medication. He was advised not to travel to the [place] while these lesions are open and was given a letter concerning this the

## 2015-06-14 IMAGING — CT CT ABD-PELV W/ CM
2 of 5 series · 17 of 46 positions shown, 19 images · IV contrast (Omnipaque 300)
Comparison: 03/17/2010

CLINICAL DATA: Abdominal pain intermittently for 1 week centered
over the lower abdomen

CT ABDOMEN AND PELVIS WITH CONTRAST
TECHNIQUE: Multidetector CT imaging of the abdomen and pelvis was
performed following the standard protocol during bolus
administration of intravenous contrast.
Contrast:  100 ml Omnipaque 300 IV contrast

[Series 2: abd/ pel 5mm · axial · 0.84mm/px · z∈[-497,+3]mm · 14 of 112 slices shown, 16 images]
[im 6/112  soft-tissue]
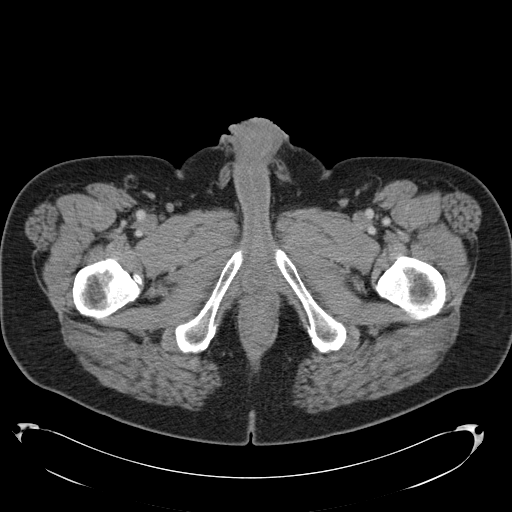
[im 6/112  bone]
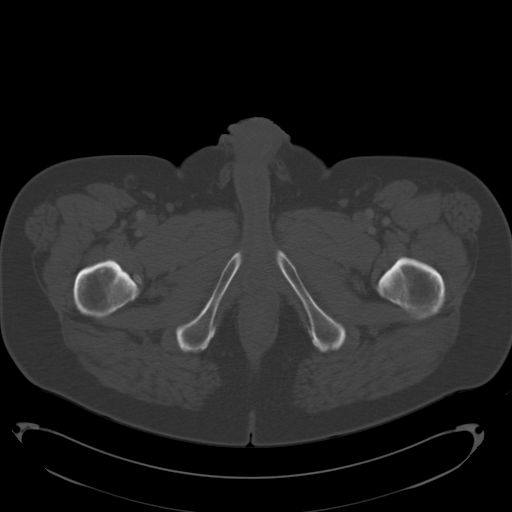
[im 17/112  soft-tissue]
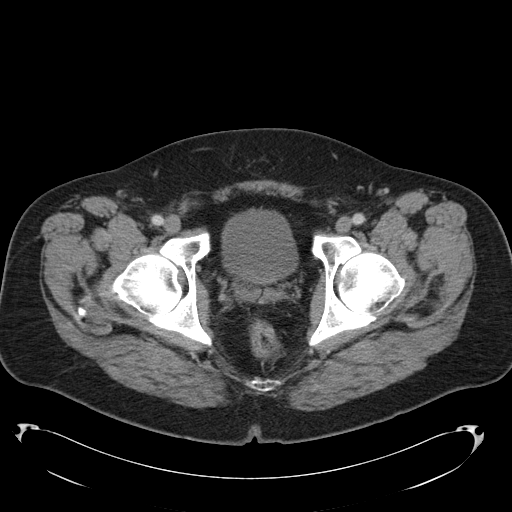
[im 23/112  soft-tissue]
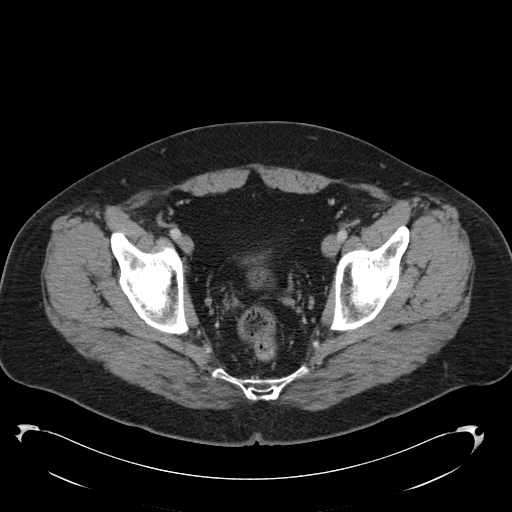
[im 28/112  soft-tissue]
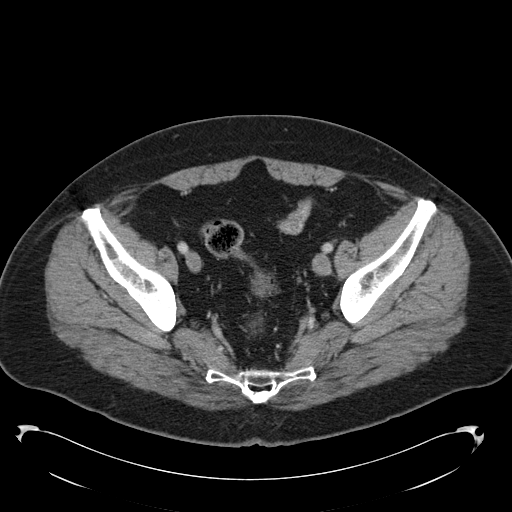
[im 39/112  soft-tissue]
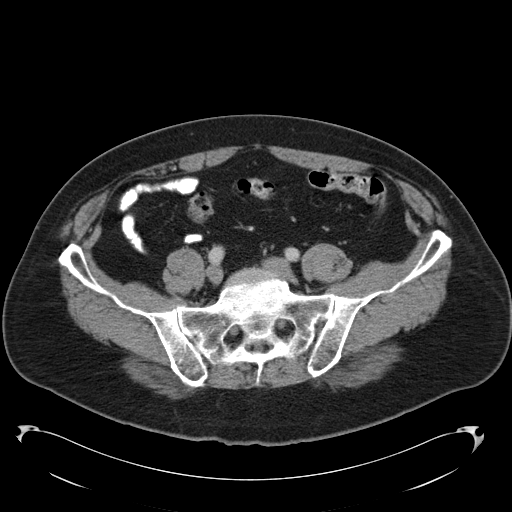
[im 45/112  soft-tissue]
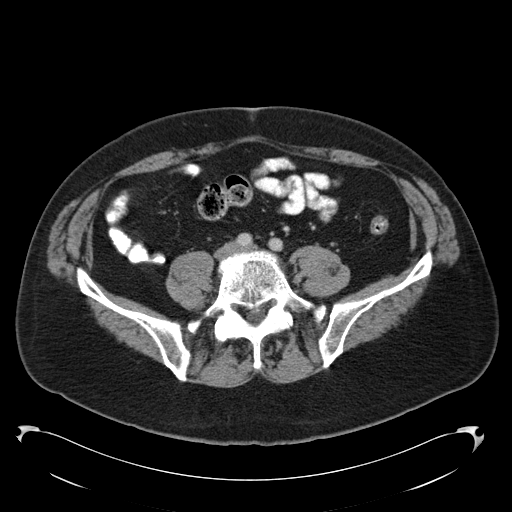
[im 50/112  soft-tissue]
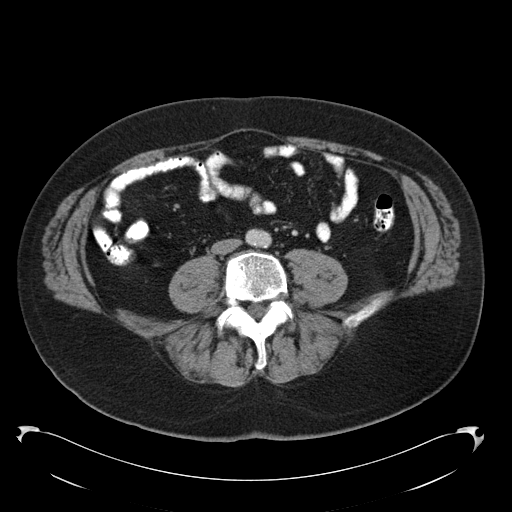
[im 62/112  soft-tissue]
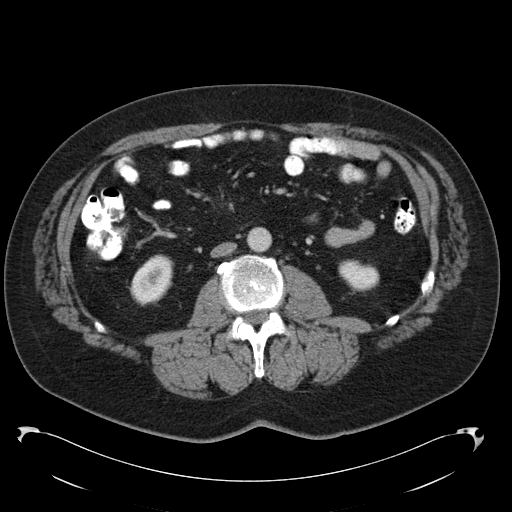
[im 67/112  soft-tissue]
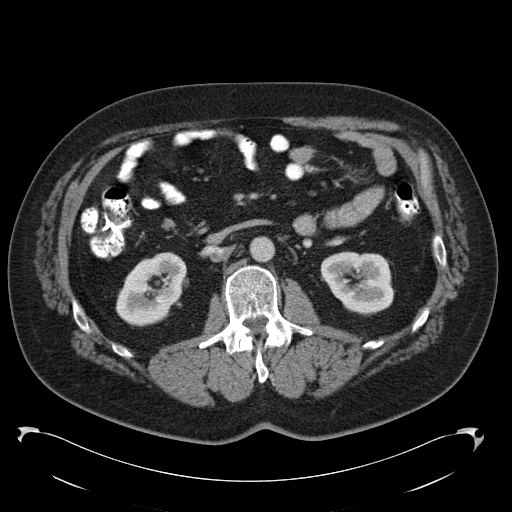
[im 67/112  bone]
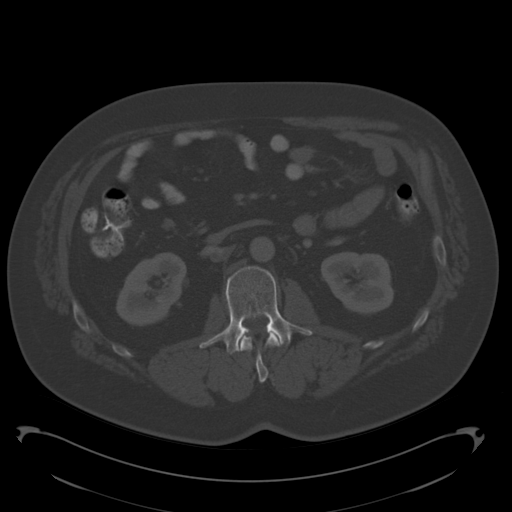
[im 73/112  soft-tissue]
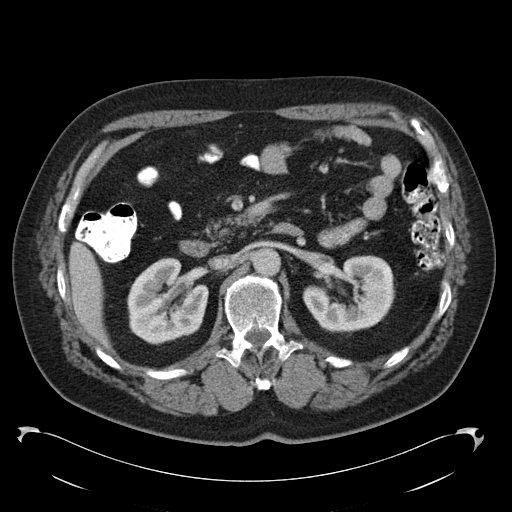
[im 84/112  soft-tissue]
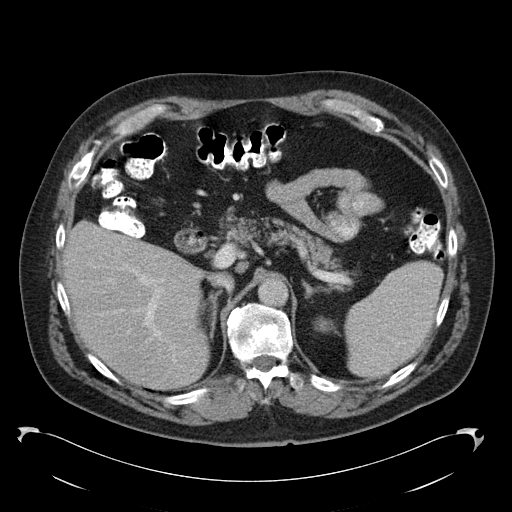
[im 89/112  soft-tissue]
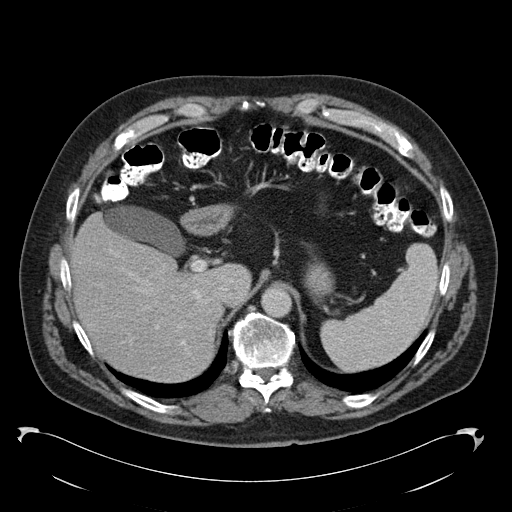
[im 95/112  soft-tissue]
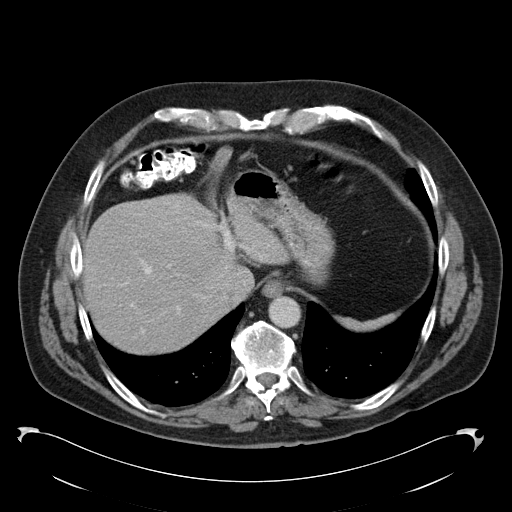
[im 106/112  soft-tissue]
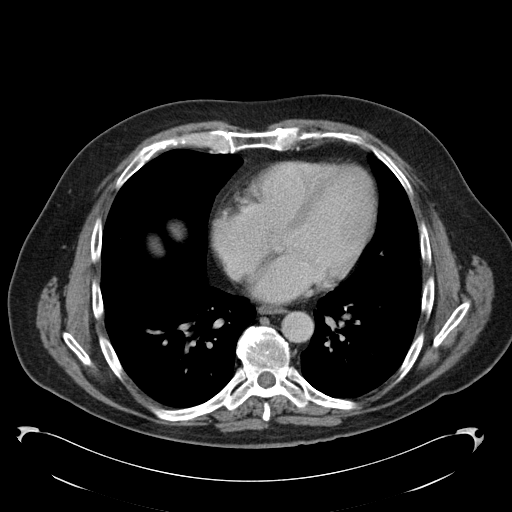

[Series 602: cor · coronal · 1.12mm/px · 3 of 132 slices shown]
[im 44/132  soft-tissue]
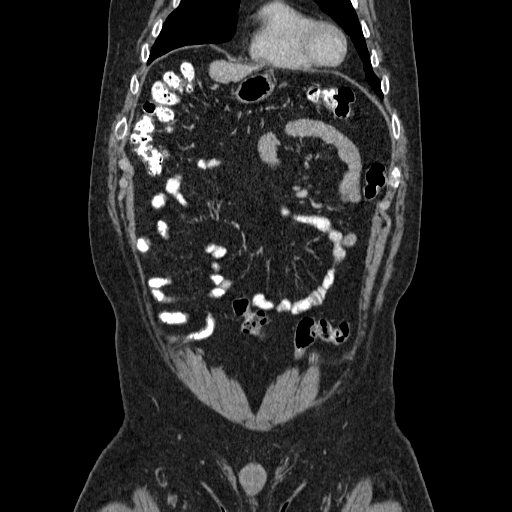
[im 59/132  soft-tissue]
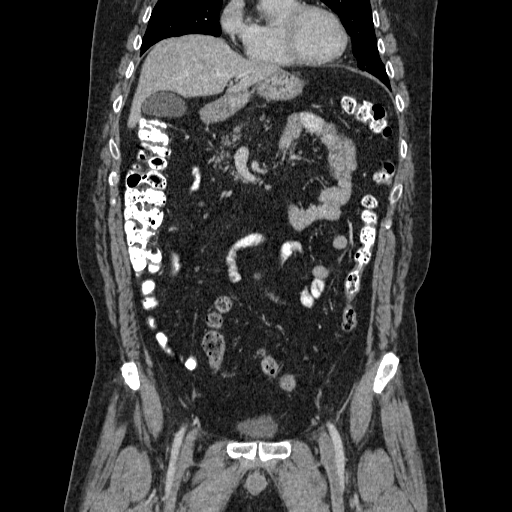
[im 73/132  soft-tissue]
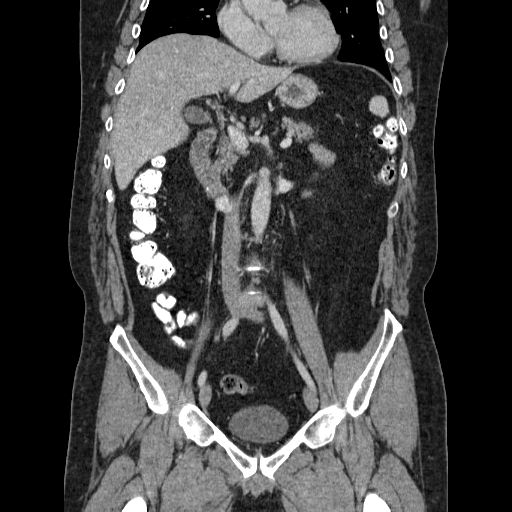

[17 of 46 positions shown; findings below may reference images not displayed]

FINDINGS: Lung bases are clear.

Liver, gallbladder, adrenal glands, kidneys, spleen, and partly
fatty infiltrated pancreas are normal.  No ascites or
lymphadenopathy.  No free air.

Normal appendix, decompressed.  No bowel wall thickening or focal
segmental dilatation.  Minimal branch point aortic atherosclerosis.
Bladder is normal.  No radiopaque renal, ureteral, or bladder
calculus.  No acute osseous abnormality.
IMPRESSION: No acute intra-abdominal or pelvic pathology.

## 2016-02-05 ENCOUNTER — Encounter: Payer: Self-pay | Admitting: Emergency Medicine

## 2016-02-05 ENCOUNTER — Ambulatory Visit (INDEPENDENT_AMBULATORY_CARE_PROVIDER_SITE_OTHER): Payer: No Typology Code available for payment source | Admitting: Urgent Care

## 2016-02-05 VITALS — BP 130/66 | HR 56 | Temp 98.0°F | Resp 16 | Ht 78.0 in | Wt 265.2 lb

## 2016-02-05 DIAGNOSIS — R197 Diarrhea, unspecified: Secondary | ICD-10-CM

## 2016-02-05 DIAGNOSIS — K30 Functional dyspepsia: Secondary | ICD-10-CM

## 2016-02-05 LAB — CBC
HCT: 45.1 % (ref 38.5–50.0)
HEMOGLOBIN: 15.6 g/dL (ref 13.2–17.1)
MCH: 28.8 pg (ref 27.0–33.0)
MCHC: 34.6 g/dL (ref 32.0–36.0)
MCV: 83.2 fL (ref 80.0–100.0)
MPV: 9.8 fL (ref 7.5–12.5)
PLATELETS: 300 10*3/uL (ref 140–400)
RBC: 5.42 MIL/uL (ref 4.20–5.80)
RDW: 13.9 % (ref 11.0–15.0)
WBC: 7.7 10*3/uL (ref 3.8–10.8)

## 2016-02-05 NOTE — Patient Instructions (Addendum)
Diarrhea Diarrhea is frequent loose and watery bowel movements. It can cause you to feel weak and dehydrated. Dehydration can cause you to become tired and thirsty, have a dry mouth, and have decreased urination that often is dark yellow. Diarrhea is a sign of another problem, most often an infection that will not last long. In most cases, diarrhea typically lasts 2-3 days. However, it can last longer if it is a sign of something more serious. It is important to treat your diarrhea as directed by your caregiver to lessen or prevent future episodes of diarrhea. CAUSES  Some common causes include:  Gastrointestinal infections caused by viruses, bacteria, or parasites.  Food poisoning or food allergies.  Certain medicines, such as antibiotics, chemotherapy, and laxatives.  Artificial sweeteners and fructose.  Digestive disorders. HOME CARE INSTRUCTIONS  Ensure adequate fluid intake (hydration): Have 1 cup (8 oz) of fluid for each diarrhea episode. Avoid fluids that contain simple sugars or sports drinks, fruit juices, whole milk products, and sodas. Your urine should be clear or pale yellow if you are drinking enough fluids. Hydrate with an oral rehydration solution that you can purchase at pharmacies, retail stores, and online. You can prepare an oral rehydration solution at home by mixing the following ingredients together:   - tsp table salt.   tsp baking soda.   tsp salt substitute containing potassium chloride.  1  tablespoons sugar.  1 L (34 oz) of water.  Certain foods and beverages may increase the speed at which food moves through the gastrointestinal (GI) tract. These foods and beverages should be avoided and include:  Caffeinated and alcoholic beverages.  High-fiber foods, such as raw fruits and vegetables, nuts, seeds, and whole grain breads and cereals.  Foods and beverages sweetened with sugar alcohols, such as xylitol, sorbitol, and mannitol.  Some foods may be well  tolerated and may help thicken stool including:  Starchy foods, such as rice, toast, pasta, low-sugar cereal, oatmeal, grits, baked potatoes, crackers, and bagels.  Bananas.  Applesauce.  Add probiotic-rich foods to help increase healthy bacteria in the GI tract, such as yogurt and fermented milk products.  Wash your hands well after each diarrhea episode.  Only take over-the-counter or prescription medicines as directed by your caregiver.  Take a warm bath to relieve any burning or pain from frequent diarrhea episodes. SEEK IMMEDIATE MEDICAL CARE IF:   You are unable to keep fluids down.  You have persistent vomiting.  You have blood in your stool, or your stools are black and tarry.  You do not urinate in 6-8 hours, or there is only a small amount of very dark urine.  You have abdominal pain that increases or localizes.  You have weakness, dizziness, confusion, or light-headedness.  You have a severe headache.  Your diarrhea gets worse or does not get better.  You have a fever or persistent symptoms for more than 2-3 days.  You have a fever and your symptoms suddenly get worse. MAKE SURE YOU:   Understand these instructions.  Will watch your condition.  Will get help right away if you are not doing well or get worse.   This information is not intended to replace advice given to you by your health care provider. Make sure you discuss any questions you have with your health care provider.   Document Released: 05/06/2002 Document Revised: 06/06/2014 Document Reviewed: 01/22/2012 Elsevier Interactive Patient Education [employer].     IF you received an x-ray today, you will  receive an Pharmacologist from [facility]. Please contact [facility] at [phone] with questions or concerns regarding your invoice.   IF you received labwork today, you will receive an invoice from [employer]. Please contact [facility] at  [phone] with questions or concerns regarding your invoice.   Our billing staff will not be able to assist you with questions regarding bills from these companies.  You will be contacted with the lab results as soon as they are available. The fastest way to get your results is to activate your My Chart account. Instructions are located on the last page of this paperwork. If you have not heard from [place] regarding the results in 2 weeks, please contact this office.

## 2016-02-05 NOTE — Progress Notes (Signed)
    MRN: [id number] DOB: 1960/09/30  Subjective:   Barry Thomas is a 55 y.o. male presenting for chief complaint of STOMACH PROBLEMS (LAST WEDNESDAY ) and Diarrhea  Reports 1 week history of intermittent watery diarrhea (8-10 BM), upset stomach, feeling gassy, subjective fever, fatigue. Patient is hydrating well, eating foods he normally eats. Denies bloody stools, nausea and vomiting, decreased appetite. No recent antibiotics.  Leshon has a current medication list which includes the following prescription(s): cetirizine, fluticasone, albuterol, epinephrine, and prednisone. Also is allergic to aspirin and nsaids.  Kavian  has a past medical history of Basal cell cancer (2008, 2010); Pancreatitis; and Seasonal allergies. Also  has a past surgical history that includes removal basal cell (2008, 2010).  Objective:   Vitals: BP 130/66 (BP Location: Right Arm, Patient Position: Sitting, Cuff Size: Large)   Pulse (!) 56   Temp 98 F (36.7 C) (Oral)   Resp 16   Ht 6\' 6"  (1.981 m)   Wt 265 lb 3.2 oz (120.3 kg)   SpO2 98%   BMI 30.65 kg/m   Physical Exam  Constitutional: He is oriented to person, place, and time. He appears well-developed and well-nourished.  HENT:  Mouth/Throat: Oropharynx is clear and moist.  Eyes: No scleral icterus.  Cardiovascular: Normal rate, regular rhythm and intact distal pulses.  Exam reveals no gallop and no friction rub.   No murmur heard. Pulmonary/Chest: No respiratory distress. He has no wheezes. He has no rales.  Abdominal: Soft. Bowel sounds are normal. He exhibits no distension and no mass. There is no tenderness. There is no guarding.  Neurological: He is alert and oriented to person, place, and time.  Skin: Skin is warm and dry.   Assessment and Plan :   1. Diarrhea, unspecified type 2. Upset stomach - GI panel pending, recommended conservative management. Discussed warning signs/symptoms warranting rtc or ED visit if his problem persists while  GI panel results.  Jaynee Eagles, PA-C [facility] [phone] 02/05/2016 12:24 PM

## 2016-02-06 LAB — COMPREHENSIVE METABOLIC PANEL
ALBUMIN: 4.1 g/dL (ref 3.6–5.1)
ALT: 44 U/L (ref 9–46)
AST: 23 U/L (ref 10–35)
Alkaline Phosphatase: 62 U/L (ref 40–115)
BILIRUBIN TOTAL: 0.6 mg/dL (ref 0.2–1.2)
BUN: 12 mg/dL (ref 7–25)
CO2: 23 mmol/L (ref 20–31)
CREATININE: 0.91 mg/dL (ref 0.70–1.33)
Calcium: 9.1 mg/dL (ref 8.6–10.3)
Chloride: 105 mmol/L (ref 98–110)
Glucose, Bld: 100 mg/dL — ABNORMAL HIGH (ref 65–99)
Potassium: 4.4 mmol/L (ref 3.5–5.3)
SODIUM: 138 mmol/L (ref 135–146)
Total Protein: 6.7 g/dL (ref 6.1–8.1)

## 2016-02-09 LAB — GASTROINTESTINAL PATHOGEN PANEL PCR
C. difficile Tox A/B, PCR: NOT DETECTED
CRYPTOSPORIDIUM, PCR: NOT DETECTED
Campylobacter, PCR: NOT DETECTED
E COLI (ETEC) LT/ST, PCR: NOT DETECTED
E COLI (STEC) STX1/STX2, PCR: NOT DETECTED
E COLI 0157, PCR: NOT DETECTED
Giardia lamblia, PCR: NOT DETECTED
NOROVIRUS, PCR: NOT DETECTED
Rotavirus A, PCR: NOT DETECTED
Salmonella, PCR: NOT DETECTED
Shigella, PCR: NOT DETECTED

## 2017-07-26 ENCOUNTER — Other Ambulatory Visit: Payer: Self-pay

## 2017-09-26 ENCOUNTER — Ambulatory Visit: Payer: No Typology Code available for payment source | Admitting: Family Medicine

## 2017-09-26 ENCOUNTER — Encounter: Payer: Self-pay | Admitting: Family Medicine

## 2017-09-26 VITALS — BP 138/86 | HR 73 | Temp 98.5°F | Resp 12 | Ht 78.0 in | Wt 269.0 lb

## 2017-09-26 DIAGNOSIS — W57XXXA Bitten or stung by nonvenomous insect and other nonvenomous arthropods, initial encounter: Secondary | ICD-10-CM | POA: Diagnosis not present

## 2017-09-26 DIAGNOSIS — L0390 Cellulitis, unspecified: Secondary | ICD-10-CM | POA: Diagnosis not present

## 2017-09-26 DIAGNOSIS — J309 Allergic rhinitis, unspecified: Secondary | ICD-10-CM

## 2017-09-26 DIAGNOSIS — S30860A Insect bite (nonvenomous) of lower back and pelvis, initial encounter: Secondary | ICD-10-CM

## 2017-09-26 MED ORDER — DOXYCYCLINE HYCLATE 100 MG PO TABS
100.0000 mg | ORAL_TABLET | Freq: Two times a day (BID) | ORAL | 0 refills | Status: DC
Start: 2017-09-26 — End: 2019-01-02

## 2017-09-26 NOTE — Progress Notes (Signed)
Subjective:  Barry Thomas is a 57 y.o. male who presents today with a chief complaint of skin rash and to establish care.  HPI:  Skin Rash, Acute Issue Started about 4 days ago. Located on right mid back.  Found a tick attached the day after the rash started.  Seem to be improving yesterday, but is a lot worse this morning.  He has tried over-the-counter cream which is helped a little bit with itching.  Associated symptoms include small amount of pain and itching.  Rash is very erythematous.  No other obvious alleviating or aggravating factors. S  Allergic rhinitis, chronic problem, new to provider Several year history.  Currently on Zyrtec and Flonase.  Symptoms are stable.  ROS: Per HPI, otherwise a complete review of systems was negative.   PMH:  The following were reviewed and entered/updated in epic: Past Medical History:  Diagnosis Date  . Basal cell cancer 2008, 2010   leg and face  . 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 Seasonal allergies    Patient Active Problem List   Diagnosis Date Noted  . 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 Allergic rhinitis 03/16/2010  . CARCINOMA, BASAL CELL, HX OF 03/16/2010   Past Surgical History:  Procedure Laterality Date  . removal basal cell  2008, 2010   face and leg    Family History  Problem Relation Age of Onset  . Psoriasis Mother   . Hypertension Mother   . Atrial fibrillation Mother   . Diabetes Father   . Cancer Father   . Hypertension Father   . Cancer Sister   . Melanoma Sister   . Colon cancer Neg Hx   . Esophageal cancer Neg Hx   . Stomach cancer Neg Hx     Medications- reviewed and updated Current Outpatient Medications  Medication Sig Dispense Refill  . cetirizine (ZYRTEC) 10 MG tablet Take 10 mg by mouth as needed.     . doxycycline (VIBRA-TABS) 100 MG tablet Take 1 tablet (100 mg total) by mouth 2 (two) times daily. 14 tablet 0  . EPINEPHrine 0.3 mg/0.3 mL IJ SOAJ  injection Inject 0.3 mLs (0.3 mg total) into the muscle once. 1 Device 2  . fluticasone (FLONASE) 50 MCG/ACT nasal spray Place into both nostrils daily.     No current facility-administered medications for this visit.     Allergies-reviewed and updated Allergies  Allergen Reactions  . Aspirin Swelling    Stomach cramps  . Nsaids     REACTION: swelling and stomach cramps    Social History   Socioeconomic History  . Marital status: Married    Spouse name: Not on file  . Number of children: 0  . Years of education: Not on file  . Highest education level: Not on file  Occupational History  . Occupation: Tax inspector: [employer]  Social Needs  . Financial resource strain: Not on file  . Food insecurity:    Worry: Not on file    Inability: Not on file  . Transportation needs:    Medical: Not on file    Non-medical: Not on file  Tobacco Use  . Smoking status: Never Smoker  . Smokeless tobacco: Never Used  Substance and Sexual Activity  . Alcohol use: No  . Drug use: No  . Sexual activity: Yes    Birth control/protection: Condom  Lifestyle  . Physical activity:  Days per week: Not on file    Minutes per session: Not on file  . Stress: Not on file  Relationships  . Social connections:    Talks on phone: Not on file    Gets together: Not on file    Attends religious service: Not on file    Active member of club or organization: Not on file    Attends meetings of clubs or organizations: Not on file    Relationship status: Not on file  Other Topics Concern  . Not on file  Social History Narrative  . Not on file     Objective:  Physical Exam: BP 138/86 (BP Location: Left Arm)   Pulse 73   Temp 98.5 F (36.9 C)   Resp 12   Ht 6\' 6"  (1.981 m)   Wt 269 lb (122 kg)   SpO2 98%   BMI 31.09 kg/m   Gen: NAD, resting comfortably CV: RRR with no murmurs appreciated Pulm: NWOB, CTAB with no crackles, wheezes, or rhonchi GI: Normal bowel  sounds present. Soft, Nontender, Nondistended. MSK: No edema, cyanosis, or clubbing noted Skin: Approximately 4 x 10 cm erythematous rash on right mid back.  Please see below picture. Neuro: Grossly normal, moves all extremities Psych: Normal affect and thought content    Assessment/Plan:  Allergic rhinitis Stable.  Continue Flonase and Zyrtec.  Tick Bite / Cellulitis No systemic signs of illness.  Rash not consistent with RMSF or Lyme disease.  Start doxycycline 100 mg twice daily for the next week.  He will follow-up with me next week for his annual physical.  Preventive health care Patient will return next week for CPE.  Will need screening blood work at that time.   Algis Greenhouse. Jerline Pain, MD 09/26/2017 11:59 AM

## 2017-09-26 NOTE — Assessment & Plan Note (Signed)
Stable.  Continue Flonase and Zyrtec. 

## 2017-09-26 NOTE — Patient Instructions (Signed)
It was very nice to meet you today!  Please start the doxycycline.  Let me know if the rash worsens or does not improve over the next few days.  I will see you next week for your physical with blood work.  Take care, Dr. Jerline Pain

## 2017-10-02 ENCOUNTER — Ambulatory Visit (INDEPENDENT_AMBULATORY_CARE_PROVIDER_SITE_OTHER): Payer: No Typology Code available for payment source | Admitting: Family Medicine

## 2017-10-02 ENCOUNTER — Encounter: Payer: Self-pay | Admitting: Family Medicine

## 2017-10-02 VITALS — BP 118/62 | HR 63 | Temp 98.3°F | Resp 14 | Ht 78.0 in | Wt 269.0 lb

## 2017-10-02 DIAGNOSIS — Z0001 Encounter for general adult medical examination with abnormal findings: Secondary | ICD-10-CM | POA: Diagnosis not present

## 2017-10-02 DIAGNOSIS — I493 Ventricular premature depolarization: Secondary | ICD-10-CM | POA: Diagnosis not present

## 2017-10-02 DIAGNOSIS — Z13220 Encounter for screening for lipoid disorders: Secondary | ICD-10-CM | POA: Diagnosis not present

## 2017-10-02 DIAGNOSIS — M549 Dorsalgia, unspecified: Secondary | ICD-10-CM

## 2017-10-02 DIAGNOSIS — M545 Low back pain, unspecified: Secondary | ICD-10-CM

## 2017-10-02 DIAGNOSIS — L0390 Cellulitis, unspecified: Secondary | ICD-10-CM | POA: Diagnosis not present

## 2017-10-02 DIAGNOSIS — Z1159 Encounter for screening for other viral diseases: Secondary | ICD-10-CM

## 2017-10-02 DIAGNOSIS — Z114 Encounter for screening for human immunodeficiency virus [HIV]: Secondary | ICD-10-CM | POA: Diagnosis not present

## 2017-10-02 DIAGNOSIS — Z125 Encounter for screening for malignant neoplasm of prostate: Secondary | ICD-10-CM

## 2017-10-02 LAB — LIPID PANEL
CHOL/HDL RATIO: 4
Cholesterol: 162 mg/dL (ref 0–200)
HDL: 45.7 mg/dL (ref >39.00)
LDL CALC: 98 mg/dL (ref 0–99)
NonHDL: 115.98
TRIGLYCERIDES: 88 mg/dL (ref 0.0–149.0)
VLDL: 17.6 mg/dL (ref 0.0–40.0)

## 2017-10-02 LAB — CBC
HCT: 44.5 % (ref 39.0–52.0)
Hemoglobin: 15.1 g/dL (ref 13.0–17.0)
MCHC: 34 g/dL (ref 30.0–36.0)
MCV: 86.7 fl (ref 78.0–100.0)
PLATELETS: 270 10*3/uL (ref 150.0–400.0)
RBC: 5.13 Mil/uL (ref 4.22–5.81)
RDW: 13.8 % (ref 11.5–15.5)
WBC: 7.6 10*3/uL (ref 4.0–10.5)

## 2017-10-02 LAB — COMPREHENSIVE METABOLIC PANEL
ALT: 16 U/L (ref 0–53)
AST: 13 U/L (ref 0–37)
Albumin: 4 g/dL (ref 3.5–5.2)
Alkaline Phosphatase: 61 U/L (ref 39–117)
BUN: 13 mg/dL (ref 6–23)
CALCIUM: 9.2 mg/dL (ref 8.4–10.5)
CHLORIDE: 107 meq/L (ref 96–112)
CO2: 28 meq/L (ref 19–32)
CREATININE: 0.88 mg/dL (ref 0.40–1.50)
GFR: 94.33 mL/min (ref >60.00)
Glucose, Bld: 103 mg/dL — ABNORMAL HIGH (ref 70–99)
POTASSIUM: 4.3 meq/L (ref 3.5–5.1)
Sodium: 142 mEq/L (ref 135–145)
Total Bilirubin: 0.9 mg/dL (ref 0.2–1.2)
Total Protein: 6.3 g/dL (ref 6.0–8.3)

## 2017-10-02 LAB — PSA: PSA: 0.55 ng/mL (ref 0.10–4.00)

## 2017-10-02 NOTE — Progress Notes (Signed)
Subjective:  Barry Thomas is a 57 y.o. male who presents today for his annual comprehensive physical exam.    HPI:  He has no acute complaints today.   He has a few stable chronic problems outlined below:  1.  Left upper back pain and numbness.  Several year history.  Stable overall.  Occasionally has some left arm numbness and clicking sensation in his left upper back. 2.  Right low back pain.  Several month history.  Stable overall.  No obvious precipitating events or injuries.  No treatments tried. 3.  Tick bite/cellulitis.  Seen last week for this.  Started on doxycycline.  Symptoms significantly improved.  Lifestyle Diet: No specific diets.  Exercise: Walks multiple times per week. Walks about 4-5 miles at a time.   Depression screen PHQ 2/9 09/26/2017  Decreased Interest 0  Down, Depressed, Hopeless 0  PHQ - 2 Score 0    Health Maintenance Due  Topic Date Due  . Hepatitis C Screening  September 17, 1960  . HIV Screening  02/19/1974     ROS: Per HPI, otherwise a complete review of systems was negative.   PMH:  The following were reviewed and entered/updated in epic: Past Medical History:  Diagnosis Date  . Basal cell cancer 2008, 2010   leg and face  . 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 Seasonal allergies    Patient Active Problem List   Diagnosis Date Noted  . 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 Allergic rhinitis 03/16/2010  . CARCINOMA, BASAL CELL, HX OF 03/16/2010   Past Surgical History:  Procedure Laterality Date  . removal basal cell  2008, 2010   face and leg    Family History  Problem Relation Age of Onset  . Psoriasis Mother   . Hypertension Mother   . Atrial fibrillation Mother   . Diabetes Father   . Cancer Father   . Hypertension Father   . Cancer Sister   . Melanoma Sister   . Colon cancer Neg Hx   . Esophageal cancer Neg Hx   . Stomach cancer Neg Hx     Medications- reviewed and updated Current  Outpatient Medications  Medication Sig Dispense Refill  . cetirizine (ZYRTEC) 10 MG tablet Take 10 mg by mouth as needed.     . doxycycline (VIBRA-TABS) 100 MG tablet Take 1 tablet (100 mg total) by mouth 2 (two) times daily. 14 tablet 0  . EPINEPHrine 0.3 mg/0.3 mL IJ SOAJ injection Inject 0.3 mLs (0.3 mg total) into the muscle once. 1 Device 2  . fluticasone (FLONASE) 50 MCG/ACT nasal spray Place into both nostrils daily.     No current facility-administered medications for this visit.     Allergies-reviewed and updated Allergies  Allergen Reactions  . Aspirin Swelling    Stomach cramps  . Nsaids     REACTION: swelling and stomach cramps    Social History   Socioeconomic History  . Marital status: Married    Spouse name: Not on file  . Number of children: 0  . Years of education: Not on file  . Highest education level: Not on file  Occupational History  . Occupation: Tax inspector: [employer]  Social Needs  . Financial resource strain: Not on file  . Food insecurity:    Worry: Not on file    Inability: Not on file  . Transportation needs:    Medical: Not on  file    Non-medical: Not on file  Tobacco Use  . Smoking status: Never Smoker  . Smokeless tobacco: Never Used  Substance and Sexual Activity  . Alcohol use: No  . Drug use: No  . Sexual activity: Yes    Birth control/protection: Condom  Lifestyle  . Physical activity:    Days per week: Not on file    Minutes per session: Not on file  . Stress: Not on file  Relationships  . Social connections:    Talks on phone: Not on file    Gets together: Not on file    Attends religious service: Not on file    Active member of club or organization: Not on file    Attends meetings of clubs or organizations: Not on file    Relationship status: Not on file  Other Topics Concern  . Not on file  Social History Narrative  . Not on file    Objective:  Physical Exam: BP 118/62   Pulse 63    Temp 98.3 F (36.8 C) (Oral)   Resp 14   Ht 6\' 6"  (1.981 m)   Wt 269 lb (122 kg)   BMI 31.09 kg/m   Body mass index is 31.09 kg/m. Wt Readings from Last 3 Encounters:  10/02/17 269 lb (122 kg)  09/26/17 269 lb (122 kg)  02/05/16 265 lb 3.2 oz (120.3 kg)   Gen: NAD, resting comfortably HEENT: TMs normal bilaterally. OP clear. No thyromegaly noted.  CV: RRR with no murmurs appreciated Pulm: NWOB, CTAB with no crackles, wheezes, or rhonchi GI: Normal bowel sounds present. Soft, Nontender, Nondistended. MSK:  -Neck: No deformities.  Full range of motion in all directions. -Back: Palpable click noted with abduction of left arm in his left scapula.  Otherwise no abnormalities noted. -Upper extremities: No deformities.  Nontender to palpation.  Strength 5 out of 5 in all directions including rotator cuff muscle groups.  Tinel sign and Phalen test negative bilaterally. Skin: warm, dry.  Approximately 3 x 5 cm erythematous lesion on right mid back consistent with resolving cellulitis. Neuro: CN2-12 grossly intact. Strength 5/5 in upper and lower extremities. Reflexes symmetric and intact bilaterally.  Psych: Normal affect and thought content  Assessment/Plan:  Upper back pain Symptoms likely secondary to muscular dyssynergy.  Discussed home exercise program focused on strengthening musculature groups.  Not a candidate for NSAIDs.  Return precautions reviewed.  Follow-up as needed.  Low back pain Likely musculoskeletal.  No red flag signs or symptoms.  Patient is not a candidate for NSAIDs given his history of anaphylaxis.  We will proceed with home exercise program.  Return precautions reviewed.  Follow-up as needed.  Tick bite/cellulitis Seems to be healing normally.  Continue with expectant management.  Check CBC and CMET.  History of PVCs No ectopic beats noted on exam today.  Preventative Healthcare: Check lipid panel, hepatitis C antibody, HIV antibody, and PSA today.  Patient  Counseling(The following topics were reviewed and/or handout was given):  -Nutrition: Stressed importance of moderation in sodium/caffeine intake, saturated fat and cholesterol, caloric balance, sufficient intake of fresh fruits, vegetables, and fiber.  -Stressed the importance of regular exercise.   -Substance Abuse: Discussed cessation/primary prevention of tobacco, alcohol, or other drug use; driving or other dangerous activities under the influence; availability of treatment for abuse.   -Injury prevention: Discussed safety belts, safety helmets, smoke detector, smoking near bedding or upholstery.   -Sexuality: Discussed sexually transmitted diseases, partner selection, use of condoms,  avoidance of unintended pregnancy and contraceptive alternatives.   -Dental health: Discussed importance of regular tooth brushing, flossing, and dental visits.  -Health maintenance and immunizations reviewed. Please refer to Health maintenance section.  Return to care in 1 year for next preventative visit.   Algis Greenhouse. Jerline Pain, MD 10/02/2017 8:35 AM

## 2017-10-02 NOTE — Patient Instructions (Signed)

## 2017-10-03 LAB — HEPATITIS C ANTIBODY
HEP C AB: NONREACTIVE
SIGNAL TO CUT-OFF: 0.06 (ref <1.00)

## 2017-10-03 LAB — HIV ANTIBODY (ROUTINE TESTING W REFLEX): HIV 1&2 Ab, 4th Generation: NONREACTIVE

## 2018-10-04 ENCOUNTER — Encounter: Payer: No Typology Code available for payment source | Admitting: Family Medicine

## 2019-01-02 ENCOUNTER — Encounter: Payer: Self-pay | Admitting: Family Medicine

## 2019-01-03 ENCOUNTER — Other Ambulatory Visit: Payer: Self-pay

## 2019-01-03 ENCOUNTER — Encounter: Payer: Self-pay | Admitting: Family Medicine

## 2019-01-03 ENCOUNTER — Ambulatory Visit (INDEPENDENT_AMBULATORY_CARE_PROVIDER_SITE_OTHER): Payer: No Typology Code available for payment source | Admitting: Family Medicine

## 2019-01-03 VITALS — BP 132/84 | HR 65 | Temp 97.7°F | Ht 78.0 in | Wt 263.6 lb

## 2019-01-03 DIAGNOSIS — Z6830 Body mass index (BMI) 30.0-30.9, adult: Secondary | ICD-10-CM

## 2019-01-03 DIAGNOSIS — E669 Obesity, unspecified: Secondary | ICD-10-CM

## 2019-01-03 DIAGNOSIS — E782 Mixed hyperlipidemia: Secondary | ICD-10-CM | POA: Insufficient documentation

## 2019-01-03 DIAGNOSIS — E785 Hyperlipidemia, unspecified: Secondary | ICD-10-CM | POA: Insufficient documentation

## 2019-01-03 DIAGNOSIS — Z125 Encounter for screening for malignant neoplasm of prostate: Secondary | ICD-10-CM

## 2019-01-03 DIAGNOSIS — Z13220 Encounter for screening for lipoid disorders: Secondary | ICD-10-CM

## 2019-01-03 DIAGNOSIS — Z0001 Encounter for general adult medical examination with abnormal findings: Secondary | ICD-10-CM | POA: Diagnosis not present

## 2019-01-03 DIAGNOSIS — R739 Hyperglycemia, unspecified: Secondary | ICD-10-CM

## 2019-01-03 DIAGNOSIS — J309 Allergic rhinitis, unspecified: Secondary | ICD-10-CM

## 2019-01-03 HISTORY — DX: Mixed hyperlipidemia: E78.2

## 2019-01-03 LAB — COMPREHENSIVE METABOLIC PANEL
ALT: 16 U/L (ref 0–53)
AST: 14 U/L (ref 0–37)
Albumin: 4.2 g/dL (ref 3.5–5.2)
Alkaline Phosphatase: 65 U/L (ref 39–117)
BUN: 14 mg/dL (ref 6–23)
CO2: 27 mEq/L (ref 19–32)
Calcium: 9.4 mg/dL (ref 8.4–10.5)
Chloride: 103 mEq/L (ref 96–112)
Creatinine, Ser: 0.84 mg/dL (ref 0.40–1.50)
GFR: 93.25 mL/min (ref >60.00)
Glucose, Bld: 92 mg/dL (ref 70–99)
Potassium: 4.7 mEq/L (ref 3.5–5.1)
Sodium: 138 mEq/L (ref 135–145)
Total Bilirubin: 0.9 mg/dL (ref 0.2–1.2)
Total Protein: 6.6 g/dL (ref 6.0–8.3)

## 2019-01-03 LAB — CBC
HCT: 46.9 % (ref 39.0–52.0)
Hemoglobin: 15.6 g/dL (ref 13.0–17.0)
MCHC: 33.2 g/dL (ref 30.0–36.0)
MCV: 86.6 fl (ref 78.0–100.0)
Platelets: 263 10*3/uL (ref 150.0–400.0)
RBC: 5.41 Mil/uL (ref 4.22–5.81)
RDW: 13.9 % (ref 11.5–15.5)
WBC: 7.3 10*3/uL (ref 4.0–10.5)

## 2019-01-03 LAB — TSH: TSH: 1.19 u[IU]/mL (ref 0.35–4.50)

## 2019-01-03 LAB — LIPID PANEL
Cholesterol: 172 mg/dL (ref 0–200)
HDL: 43.3 mg/dL (ref >39.00)
LDL Cholesterol: 113 mg/dL — ABNORMAL HIGH (ref 0–99)
NonHDL: 129.12
Total CHOL/HDL Ratio: 4
Triglycerides: 81 mg/dL (ref 0.0–149.0)
VLDL: 16.2 mg/dL (ref 0.0–40.0)

## 2019-01-03 LAB — PSA: PSA: 0.45 ng/mL (ref 0.10–4.00)

## 2019-01-03 LAB — HEMOGLOBIN A1C: Hgb A1c MFr Bld: 5.8 % (ref 4.6–6.5)

## 2019-01-03 NOTE — Assessment & Plan Note (Signed)
Sinus pressure likely due to allergic rhinitis.  Continue Flonase and Zyrtec.  Discussed reasons to return to care.

## 2019-01-03 NOTE — Patient Instructions (Signed)
It was very nice to see you today!  Please let me know if your sinus pressure or foot symptoms worsen.  We will check blood work today.   Come back in 1 year for your next check up or sooner if needed.   Take care, Dr Jerline Pain  Please try these tips to maintain a healthy lifestyle:   Eat at least 3 REAL meals and 1-2 snacks per day.  Aim for no more than 5 hours between eating.  If you eat breakfast, please do so within one hour of getting up.    Obtain twice as many fruits/vegetables as protein or carbohydrate foods for both lunch and dinner. (Half of each meal should be fruits/vegetables, one quarter protein, and one quarter starchy carbs)   Cut down on sweet beverages. This includes juice, soda, and sweet tea.    Exercise at least 150 minutes every week.    Preventive Care 39-27 Years Old, Male Preventive care refers to lifestyle choices and visits with your health care provider that can promote health and wellness. This includes:  A yearly physical exam. This is also called an annual well check.  Regular dental and eye exams.  Immunizations.  Screening for certain conditions.  Healthy lifestyle choices, such as eating a healthy diet, getting regular exercise, not using drugs or products that contain nicotine and tobacco, and limiting alcohol use. What can I expect for my preventive care visit? Physical exam Your health care provider will check:  Height and weight. These may be used to calculate body mass index (BMI), which is a measurement that tells if you are at a healthy weight.  Heart rate and blood pressure.  Your skin for abnormal spots. Counseling Your health care provider may ask you questions about:  Alcohol, tobacco, and drug use.  Emotional well-being.  Home and relationship well-being.  Sexual activity.  Eating habits.  Work and work Statistician. What immunizations do I need?  Influenza (flu) vaccine  This is recommended every year.  Tetanus, diphtheria, and pertussis (Tdap) vaccine  You may need a Td booster every 10 years. Varicella (chickenpox) vaccine  You may need this vaccine if you have not already been vaccinated. Zoster (shingles) vaccine  You may need this after age 61. Measles, mumps, and rubella (MMR) vaccine  You may need at least one dose of MMR if you were born in 1957 or later. You may also need a second dose. Pneumococcal conjugate (PCV13) vaccine  You may need this if you have certain conditions and were not previously vaccinated. Pneumococcal polysaccharide (PPSV23) vaccine  You may need one or two doses if you smoke cigarettes or if you have certain conditions. Meningococcal conjugate (MenACWY) vaccine  You may need this if you have certain conditions. Hepatitis A vaccine  You may need this if you have certain conditions or if you travel or work in places where you may be exposed to hepatitis A. Hepatitis B vaccine  You may need this if you have certain conditions or if you travel or work in places where you may be exposed to hepatitis B. Haemophilus influenzae type b (Hib) vaccine  You may need this if you have certain risk factors. Human papillomavirus (HPV) vaccine  If recommended by your health care provider, you may need three doses over 6 months. You may receive vaccines as individual doses or as more than one vaccine together in one shot (combination vaccines). Talk with your health care provider about the risks and benefits of combination  vaccines. What tests do I need? Blood tests  Lipid and cholesterol levels. These may be checked every 5 years, or more frequently if you are over 32 years old.  Hepatitis C test.  Hepatitis B test. Screening  Lung cancer screening. You may have this screening every year starting at age 68 if you have a 30-pack-year history of smoking and currently smoke or have quit within the past 15 years.  Prostate cancer screening. Recommendations  will vary depending on your family history and other risks.  Colorectal cancer screening. All adults should have this screening starting at age 58 and continuing until age 45. Your health care provider may recommend screening at age 66 if you are at increased risk. You will have tests every 1-10 years, depending on your results and the type of screening test.  Diabetes screening. This is done by checking your blood sugar (glucose) after you have not eaten for a while (fasting). You may have this done every 1-3 years.  Sexually transmitted disease (STD) testing. Follow these instructions at home: Eating and drinking  Eat a diet that includes fresh fruits and vegetables, whole grains, lean protein, and low-fat dairy products.  Take vitamin and mineral supplements as recommended by your health care provider.  Do not drink alcohol if your health care provider tells you not to drink.  If you drink alcohol: ? Limit how much you have to 0-2 drinks a day. ? Be aware of how much alcohol is in your drink. In the [place] one drink equals one 12 oz bottle of beer (355 mL), one 5 oz glass of wine (148 mL), or one 1 oz glass of hard liquor (44 mL). Lifestyle  Take daily care of your teeth and gums.  Stay active. Exercise for at least 30 minutes on 5 or more days each week.  Do not use any products that contain nicotine or tobacco, such as cigarettes, e-cigarettes, and chewing tobacco. If you need help quitting, ask your health care provider.  If you are sexually active, practice safe sex. Use a condom or other form of protection to prevent STIs (sexually transmitted infections).  Talk with your health care provider about taking a low-dose aspirin every day starting at age 79. What's next?  Go to your health care provider once a year for a well check visit.  Ask your health care provider how often you should have your eyes and teeth checked.  Stay up to date on all vaccines. This information is  not intended to replace advice given to you by your health care provider. Make sure you discuss any questions you have with your health care provider. Document Released: 06/12/2015 Document Revised: 05/10/2018 Document Reviewed: 05/10/2018 Elsevier Patient Education  2020 [employer].

## 2019-01-03 NOTE — Progress Notes (Signed)
Chief Complaint:  Barry Thomas is a 58 y.o. male who presents today for his annual comprehensive physical exam.    Assessment/Plan:  Allergic rhinitis Sinus pressure likely due to allergic rhinitis.  Continue Flonase and Zyrtec.  Discussed reasons to return to care.   Hyperglycemia Check A1c, CBC, CMET, and TSH.   Gait Difficulty Patient ambulated in office today without difficulty.  Physical exam is normal.  No clear etiology for his feelings of "foot slipping" every few months.  We will continue with watchful waiting.  Discussed reasons to return to care.  Body mass index is 30.46 kg/m. / Obesity BMI Metric Follow Up - 01/03/19 0827      BMI Metric Follow Up-Please document annually   BMI Metric Follow Up  Education provided       Preventative Healthcare: Check CBC, CMET, TSH, PSA, A1c, and lipid panel. Due for flu vaccine later this year.   Patient Counseling(The following topics were reviewed and/or handout was given):  -Nutrition: Stressed importance of moderation in sodium/caffeine intake, saturated fat and cholesterol, caloric balance, sufficient intake of fresh fruits, vegetables, and fiber.  -Stressed the importance of regular exercise.   -Substance Abuse: Discussed cessation/primary prevention of tobacco, alcohol, or other drug use; driving or other dangerous activities under the influence; availability of treatment for abuse.   -Injury prevention: Discussed safety belts, safety helmets, smoke detector, smoking near bedding or upholstery.   -Sexuality: Discussed sexually transmitted diseases, partner selection, use of condoms, avoidance of unintended pregnancy and contraceptive alternatives.   -Dental health: Discussed importance of regular tooth brushing, flossing, and dental visits.  -Health maintenance and immunizations reviewed. Please refer to Health maintenance section.  Return to care in 1 year for next preventative visit.     Subjective:  HPI:  He has  no acute complaints today.   He has had some increasing sinus pressure over last several weeks to months.  Thinks that this is possibly due to allergies.  No fevers or chills.  No cough, sneeze, or sore throat.  He is also had some issues with occasionally feeling like his left foot slides beneath him.  This will happen once every 3 to 4 months.  Has never fallen.  No pain.  No swelling.  His stable, chronic medical conditions are outlined below:   # Allergic Rhinitis - Takes zyrtec and flonase as needed  Lifestyle Diet: Balanced. Tries to eat plenty of fruits and vegetables.  Exercise: Walks several times per week.   Depression screen PHQ 2/9 01/02/2019  Decreased Interest 0  Down, Depressed, Hopeless 0  PHQ - 2 Score 0    Health Maintenance Due  Topic Date Due  . INFLUENZA VACCINE  12/29/2018     ROS: Per HPI, otherwise a complete review of systems was negative.   PMH:  The following were reviewed and entered/updated in epic: Past Medical History:  Diagnosis Date  . Basal cell cancer 2008, 2010   leg and face  . 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 Seasonal allergies    Patient Active Problem List   Diagnosis Date Noted  . PVC's (premature ventricular contractions) 10/02/2017  . 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 Allergic rhinitis 03/16/2010  . CARCINOMA, BASAL CELL, HX OF 03/16/2010   Past Surgical History:  Procedure Laterality Date  . removal basal cell  2008, 2010   face and leg    Family History  Problem Relation Age of Onset  .  Psoriasis Mother   . Hypertension Mother   . Atrial fibrillation Mother   . Diabetes Father   . Cancer Father   . Hypertension Father   . Cancer Sister   . Melanoma Sister   . Colon cancer Neg Hx   . Esophageal cancer Neg Hx   . Stomach cancer Neg Hx     Medications- reviewed and updated Current Outpatient Medications  Medication Sig Dispense Refill  . cetirizine (ZYRTEC) 10 MG tablet Take  10 mg by mouth as needed.     . fluticasone (FLONASE) 50 MCG/ACT nasal spray Place into both nostrils daily.     No current facility-administered medications for this visit.     Allergies-reviewed and updated Allergies  Allergen Reactions  . Aspirin Swelling    Stomach cramps  . Nsaids     REACTION: swelling and stomach cramps    Social History   Socioeconomic History  . Marital status: Married    Spouse name: Not on file  . Number of children: 0  . Years of education: Not on file  . Highest education level: Not on file  Occupational History  . Occupation: Tax inspector: [employer]  Social Needs  . Financial resource strain: Not on file  . Food insecurity    Worry: Not on file    Inability: Not on file  . Transportation needs    Medical: Not on file    Non-medical: Not on file  Tobacco Use  . Smoking status: Never Smoker  . Smokeless tobacco: Never Used  Substance and Sexual Activity  . Alcohol use: No  . Drug use: No  . Sexual activity: Yes    Birth control/protection: Condom  Lifestyle  . Physical activity    Days per week: Not on file    Minutes per session: Not on file  . Stress: Not on file  Relationships  . Social Herbalist on phone: Not on file    Gets together: Not on file    Attends religious service: Not on file    Active member of club or organization: Not on file    Attends meetings of clubs or organizations: Not on file    Relationship status: Not on file  Other Topics Concern  . Not on file  Social History Narrative  . Not on file        Objective:  Physical Exam: BP 132/84 (BP Location: Left Arm, Patient Position: Sitting, Cuff Size: Normal)   Pulse 65   Temp 97.7 F (36.5 C) (Oral)   Ht 6\' 6"  (1.981 m)   Wt 263 lb 9.6 oz (119.6 kg)   SpO2 97%   BMI 30.46 kg/m   Body mass index is 30.46 kg/m. Wt Readings from Last 3 Encounters:  01/03/19 263 lb 9.6 oz (119.6 kg)  10/02/17 269 lb (122 kg)   09/26/17 269 lb (122 kg)  Gen: NAD, resting comfortably HEENT: TMs normal bilaterally. OP clear. No thyromegaly noted.  CV: RRR with no murmurs appreciated Pulm: NWOB, CTAB with no crackles, wheezes, or rhonchi GI: Normal bowel sounds present. Soft, Nontender, Nondistended. MSK: no edema, cyanosis, or clubbing noted Skin: warm, dry Neuro: CN2-12 grossly intact. Strength 5/5 in upper and lower extremities. Reflexes symmetric and intact bilaterally.  Psych: Normal affect and thought content     Caleb M. Jerline Pain, MD 01/03/2019 8:30 AM

## 2019-01-03 NOTE — Progress Notes (Signed)
Please inform patient of the following:  His "bad" cholesterol is a bit high and his blood sugar numbers are borderline. Do not need to start medications at this point, but recommend that he continue working on diet and exercise and we can recheck in a year.  Barry Thomas. Jerline Pain, MD 01/03/2019 12:41 PM

## 2019-04-09 ENCOUNTER — Telehealth: Payer: No Typology Code available for payment source | Admitting: Physician Assistant

## 2019-04-09 DIAGNOSIS — R05 Cough: Secondary | ICD-10-CM

## 2019-04-09 DIAGNOSIS — R059 Cough, unspecified: Secondary | ICD-10-CM

## 2019-04-09 MED ORDER — PREDNISONE 5 MG PO TABS: ORAL_TABLET | ORAL | 0 refills | Status: DC

## 2019-04-09 NOTE — Progress Notes (Signed)
We are sorry that you are not feeling well.  Here is how we plan to help! Please let us know if your fever returns.   Based on your presentation I believe you most likely have A cough due to a virus.  This is called viral bronchitis and is best treated by rest, plenty of fluids and control of the cough.  You may use Ibuprofen or Tylenol as directed to help your symptoms.     Prednisone 5 mg daily for 6 days (see taper instructions below)  Directions for 6 day taper: Day 1: 2 tablets before breakfast, 1 after both lunch & dinner and 2 at bedtime Day 2: 1 tab before breakfast, 1 after both lunch & dinner and 2 at bedtime Day 3: 1 tab at each meal & 1 at bedtime Day 4: 1 tab at breakfast, 1 at lunch, 1 at bedtime Day 5: 1 tab at breakfast & 1 tab at bedtime Day 6: 1 tab at breakfast   From your responses in the eVisit questionnaire you describe inflammation in the upper respiratory tract which is causing a significant cough.  This is commonly called Bronchitis and has four common causes:    Allergies  Viral Infections  Acid Reflux  Bacterial Infection Allergies, viruses and acid reflux are treated by controlling symptoms or eliminating the cause. An example might be a cough caused by taking certain blood pressure medications. You stop the cough by changing the medication. Another example might be a cough caused by acid reflux. Controlling the reflux helps control the cough.  USE OF BRONCHODILATOR ("RESCUE") INHALERS: There is a risk from using your bronchodilator too frequently.  The risk is that over-reliance on a medication which only relaxes the muscles surrounding the breathing tubes can reduce the effectiveness of medications prescribed to reduce swelling and congestion of the tubes themselves.  Although you feel brief relief from the bronchodilator inhaler, your asthma may actually be worsening with the tubes becoming more swollen and filled with mucus.  This can delay other crucial  treatments, such as oral steroid medications. If you need to use a bronchodilator inhaler daily, several times per day, you should discuss this with your provider.  There are probably better treatments that could be used to keep your asthma under control.     HOME CARE . Only take medications as instructed by your medical team. . Complete the entire course of an antibiotic. . Drink plenty of fluids and get plenty of rest. . Avoid close contacts especially the very young and the elderly . Cover your mouth if you cough or cough into your sleeve. . Always remember to wash your hands . A steam or ultrasonic humidifier can help congestion.   GET HELP RIGHT AWAY IF: . You develop worsening fever. . You become short of breath . You cough up blood. . Your symptoms persist after you have completed your treatment plan MAKE SURE YOU   Understand these instructions.  Will watch your condition.  Will get help right away if you are not doing well or get worse.  Your e-visit answers were reviewed by a board certified advanced clinical practitioner to complete your personal care plan.  Depending on the condition, your plan could have included both over the counter or prescription medications. If there is a problem please reply  once you have received a response from your provider. Your safety is important to [place].  If you have drug allergies check your prescription carefully.  You can use MyChart to ask questions about today's visit, request a non-urgent call back, or ask for a work or school excuse for 24 hours related to this e-Visit. If it has been greater than 24 hours you will need to follow up with your provider, or enter a new e-Visit to address those concerns. You will get an e-mail in the next two days asking about your experience.  I hope that your e-visit has been valuable and will speed your recovery. Thank you for using e-visits.  Greater than 5 minutes, yet less than 10 minutes of time  have been spent researching, coordinating and implementing care for this patient today.

## 2019-08-01 ENCOUNTER — Ambulatory Visit: Payer: No Typology Code available for payment source | Attending: Internal Medicine

## 2019-08-01 DIAGNOSIS — Z23 Encounter for immunization: Secondary | ICD-10-CM | POA: Insufficient documentation

## 2019-08-01 NOTE — Progress Notes (Signed)
   Covid-19 Vaccination Clinic  Name:  Barry Thomas    MRN: [id number] DOB: [birth year]  08/01/2019  [name] was observed post Covid-19 immunization for 15 minutes without incident. He was provided with Vaccine Information Sheet and instruction to access the V-Safe system.   [name] was instructed to call 911 with any severe reactions post vaccine: Marland Kitchen Difficulty breathing  . Swelling of face and throat  . A fast heartbeat  . A bad rash all over body  . Dizziness and weakness

## 2019-08-28 ENCOUNTER — Ambulatory Visit: Payer: No Typology Code available for payment source | Attending: Internal Medicine

## 2019-08-28 DIAGNOSIS — Z23 Encounter for immunization: Secondary | ICD-10-CM

## 2019-08-28 NOTE — Progress Notes (Signed)
   Covid-19 Vaccination Clinic  Name:  Barry Thomas    MRN: [id number] DOB: March 15, 1961  08/28/2019  [name] was observed post Covid-19 immunization for 15 minutes without incident. He was provided with Vaccine Information Sheet and instruction to access the V-Safe system.   [name] was instructed to call 911 with any severe reactions post vaccine: Marland Kitchen Difficulty breathing  . Swelling of face and throat  . A fast heartbeat  . A bad rash all over body  . Dizziness and weakness   Immunizations Administered    Name Date Dose VIS Date Route   Pfizer COVID-19 Vaccine 08/28/2019  8:24 AM 0.3 mL 05/10/2019 Intramuscular   Manufacturer: [place]   Lot: [id number]   [place]: [id number]

## 2019-08-28 NOTE — Progress Notes (Signed)
   Covid-19 Vaccination Clinic  Name:  Barry Thomas    MRN: [id number] DOB: [birth year]  08/28/2019  Barry Thomas was observed post Covid-19 immunization for 15 minutes without incident. He was provided with Vaccine Information Sheet and instruction to access the V-Safe system.   Barry Thomas was instructed to call 911 with any severe reactions post vaccine: Marland Kitchen Difficulty breathing  . Swelling of face and throat  . A fast heartbeat  . A bad rash all over body  . Dizziness and weakness   Immunizations Administered    Name Date Dose VIS Date Route   Pfizer COVID-19 Vaccine 08/28/2019  8:24 AM 0.3 mL 05/10/2019 Intramuscular   Manufacturer: [place]   Lot: [id number]   [place]: [id number]

## 2020-01-07 ENCOUNTER — Encounter: Payer: No Typology Code available for payment source | Admitting: Family Medicine

## 2020-01-08 ENCOUNTER — Encounter: Payer: Self-pay | Admitting: Family Medicine

## 2020-01-08 ENCOUNTER — Other Ambulatory Visit: Payer: Self-pay

## 2020-01-08 ENCOUNTER — Ambulatory Visit (INDEPENDENT_AMBULATORY_CARE_PROVIDER_SITE_OTHER): Payer: No Typology Code available for payment source | Admitting: Family Medicine

## 2020-01-08 VITALS — BP 118/70 | HR 56 | Temp 97.7°F | Ht 78.0 in | Wt 264.2 lb

## 2020-01-08 DIAGNOSIS — Z85828 Personal history of other malignant neoplasm of skin: Secondary | ICD-10-CM

## 2020-01-08 DIAGNOSIS — Z0001 Encounter for general adult medical examination with abnormal findings: Secondary | ICD-10-CM | POA: Diagnosis not present

## 2020-01-08 DIAGNOSIS — E785 Hyperlipidemia, unspecified: Secondary | ICD-10-CM

## 2020-01-08 DIAGNOSIS — R739 Hyperglycemia, unspecified: Secondary | ICD-10-CM | POA: Diagnosis not present

## 2020-01-08 DIAGNOSIS — Z125 Encounter for screening for malignant neoplasm of prostate: Secondary | ICD-10-CM

## 2020-01-08 DIAGNOSIS — J309 Allergic rhinitis, unspecified: Secondary | ICD-10-CM

## 2020-01-08 DIAGNOSIS — Z6830 Body mass index (BMI) 30.0-30.9, adult: Secondary | ICD-10-CM

## 2020-01-08 NOTE — Progress Notes (Signed)
Chief Complaint:  Barry Thomas is a 59 y.o. male who presents today for his annual comprehensive physical exam.    Assessment/Plan:  New/Acute Problems: [place] No red flags.  Has had eye exam recently which was normal.  Possibly retinal migraines.  We will continue with watchful waiting for now given that he has no red flags and no other symptoms.  Discussed reasons to return to care including vision loss, symptoms lasting longer than 30 minutes to an hour, or development of other associated neurologic symptoms such as headache, weakness, numbness, etc.  Chronic Problems Addressed Today: Allergic rhinitis Continue Flonase and Zyrtec as needed.  Dyslipidemia Check lipids.  Hyperglycemia Check A1c.  CARCINOMA, BASAL CELL, HX OF Advised him to follow-up with dermatologist soon - has new lesion on right anterior neck likely basal cell carcinoma.   Body mass index is 30.53 kg/m. / Obese  BMI Metric Follow Up - 01/08/20 0906      BMI Metric Follow Up-Please document annually   BMI Metric Follow Up Education provided           Preventative Healthcare: Up-to-date on vaccines and screenings.  Check CBC, CMET, TSH, A1c, and lipid panel.  Due for colon cancer screening next year.  Patient Counseling(The following topics were reviewed and/or handout was given):  -Nutrition: Stressed importance of moderation in sodium/caffeine intake, saturated fat and cholesterol, caloric balance, sufficient intake of fresh fruits, vegetables, and fiber.  -Stressed the importance of regular exercise.   -Substance Abuse: Discussed cessation/primary prevention of tobacco, alcohol, or other drug use; driving or other dangerous activities under the influence; availability of treatment for abuse.   -Injury prevention: Discussed safety belts, safety helmets, smoke detector, smoking near bedding or upholstery.   -Sexuality: Discussed sexually transmitted diseases, partner selection, use of condoms,  avoidance of unintended pregnancy and contraceptive alternatives.   -Dental health: Discussed importance of regular tooth brushing, flossing, and dental visits.  -Health maintenance and immunizations reviewed. Please refer to Health maintenance section.  Return to care in 1 year for next preventative visit.     Subjective:  HPI:  He has no acute complaints today.   Over last 5 to 6 years he has had intermittent issues with "squiggly lines" in his vision.  These happen for 5 to 10 minutes and then go away.  No other associated symptoms.  No headache.  No dizziness.  No vision loss.  No decreased visual acuity.  Happens multiple times per year.  Symptoms are overall stable.  Usually occurs in 1 IV time.  Lifestyle Diet: Trying to cut back on sweets.  Exercise: Walks 3-4 times per week.   Depression screen PHQ 2/9 01/08/2020  Decreased Interest 0  Down, Depressed, Hopeless 0  PHQ - 2 Score 0    There are no preventive care reminders to display for this patient.   ROS: Per HPI, otherwise a complete review of systems was negative.   PMH:  The following were reviewed and entered/updated in epic: Past Medical History:  Diagnosis Date  . Basal cell cancer 2008, 2010   leg and face  . 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 Seasonal allergies    Patient Active Problem List   Diagnosis Date Noted  . Hyperglycemia 01/03/2019  . Dyslipidemia 01/03/2019  . PVC's (premature ventricular contractions) 10/02/2017  . 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 Allergic rhinitis 03/16/2010  . CARCINOMA, BASAL CELL, HX OF 03/16/2010   Past  Surgical History:  Procedure Laterality Date  . removal basal cell  2008, 2010   face and leg    Family History  Problem Relation Age of Onset  . Psoriasis Mother   . Hypertension Mother   . Atrial fibrillation Mother   . Diabetes Father   . Cancer Father   . Hypertension Father   . Cancer Sister   . Melanoma Sister   .  Colon cancer Neg Hx   . Esophageal cancer Neg Hx   . Stomach cancer Neg Hx     Medications- reviewed and updated Current Outpatient Medications  Medication Sig Dispense Refill  . cetirizine (ZYRTEC) 10 MG tablet Take 10 mg by mouth 2 (two) times daily.     . fluticasone (FLONASE) 50 MCG/ACT nasal spray Place into both nostrils daily.     No current facility-administered medications for this visit.    Allergies-reviewed and updated Allergies  Allergen Reactions  . Aspirin Swelling    Stomach cramps  . Nsaids     REACTION: swelling and stomach cramps    Social History   Socioeconomic History  . Marital status: Married    Spouse name: Not on file  . Number of children: 0  . Years of education: Not on file  . Highest education level: Not on file  Occupational History  . Occupation: Tax inspector: [employer]  Tobacco Use  . Smoking status: Never Smoker  . Smokeless tobacco: Never Used  Substance and Sexual Activity  . Alcohol use: No  . Drug use: No  . Sexual activity: Yes    Birth control/protection: Condom  Other Topics Concern  . Not on file  Social History Narrative  . Not on file   Social Determinants of Health   Financial Resource Strain:   . Difficulty of Paying Living Expenses:   Food Insecurity:   . Worried About Charity fundraiser in the Last Year:   . Arboriculturist in the Last Year:   Transportation Needs:   . Film/video editor (Medical):   Marland Kitchen Lack of Transportation (Non-Medical):   Physical Activity:   . Days of Exercise per Week:   . Minutes of Exercise per Session:   Stress:   . Feeling of Stress :   Social Connections:   . Frequency of Communication with Friends and Family:   . Frequency of Social Gatherings with Friends and Family:   . Attends Religious Services:   . Active Member of Clubs or Organizations:   . Attends Archivist Meetings:   Marland Kitchen Marital Status:         Objective:  Physical  Exam: BP 118/70   Pulse (!) 56   Temp 97.7 F (36.5 C)   Ht 6\' 6"  (1.981 m)   Wt 264 lb 3.2 oz (119.8 kg)   SpO2 97%   BMI 30.53 kg/m   Body mass index is 30.53 kg/m. Wt Readings from Last 3 Encounters:  01/08/20 264 lb 3.2 oz (119.8 kg)  01/03/19 263 lb 9.6 oz (119.6 kg)  10/02/17 269 lb (122 kg)   Gen: NAD, resting comfortably HEENT: TMs normal bilaterally. OP clear. No thyromegaly noted.  CV: RRR with no murmurs appreciated Pulm: NWOB, CTAB with no crackles, wheezes, or rhonchi GI: Normal bowel sounds present. Soft, Nontender, Nondistended. MSK: no edema, cyanosis, or clubbing noted Skin: warm, dry Neuro: CN2-12 grossly intact. Strength 5/5 in upper and lower extremities. Reflexes symmetric and intact  bilaterally.  Psych: Normal affect and thought content     Amiayah Giebel M. Jerline Pain, MD 01/08/2020 9:07 AM

## 2020-01-08 NOTE — Assessment & Plan Note (Signed)
Check lipids 

## 2020-01-08 NOTE — Patient Instructions (Signed)
It was very nice to see you today!  We will check blood work today.  Keep up the good work!  We will see you back in year. Please come back to see me sooner if needed.  Take care, Dr Jerline Pain  Please try these tips to maintain a healthy lifestyle:   Eat at least 3 REAL meals and 1-2 snacks per day.  Aim for no more than 5 hours between eating.  If you eat breakfast, please do so within one hour of getting up.    Each meal should contain half fruits/vegetables, one quarter protein, and one quarter carbs (no bigger than a computer mouse)   Cut down on sweet beverages. This includes juice, soda, and sweet tea.     Drink at least 1 glass of water with each meal and aim for at least 8 glasses per day   Exercise at least 150 minutes every week.    Preventive Care 76-61 Years Old, Male Preventive care refers to lifestyle choices and visits with your health care provider that can promote health and wellness. This includes:  A yearly physical exam. This is also called an annual well check.  Regular dental and eye exams.  Immunizations.  Screening for certain conditions.  Healthy lifestyle choices, such as eating a healthy diet, getting regular exercise, not using drugs or products that contain nicotine and tobacco, and limiting alcohol use. What can I expect for my preventive care visit? Physical exam Your health care provider will check:  Height and weight. These may be used to calculate body mass index (BMI), which is a measurement that tells if you are at a healthy weight.  Heart rate and blood pressure.  Your skin for abnormal spots. Counseling Your health care provider may ask you questions about:  Alcohol, tobacco, and drug use.  Emotional well-being.  Home and relationship well-being.  Sexual activity.  Eating habits.  Work and work Statistician. What immunizations do I need?  Influenza (flu) vaccine  This is recommended every year. Tetanus,  diphtheria, and pertussis (Tdap) vaccine  You may need a Td booster every 10 years. Varicella (chickenpox) vaccine  You may need this vaccine if you have not already been vaccinated. Zoster (shingles) vaccine  You may need this after age 62. Measles, mumps, and rubella (MMR) vaccine  You may need at least one dose of MMR if you were born in 1957 or later. You may also need a second dose. Pneumococcal conjugate (PCV13) vaccine  You may need this if you have certain conditions and were not previously vaccinated. Pneumococcal polysaccharide (PPSV23) vaccine  You may need one or two doses if you smoke cigarettes or if you have certain conditions. Meningococcal conjugate (MenACWY) vaccine  You may need this if you have certain conditions. Hepatitis A vaccine  You may need this if you have certain conditions or if you travel or work in places where you may be exposed to hepatitis A. Hepatitis B vaccine  You may need this if you have certain conditions or if you travel or work in places where you may be exposed to hepatitis B. Haemophilus influenzae type b (Hib) vaccine  You may need this if you have certain risk factors. Human papillomavirus (HPV) vaccine  If recommended by your health care provider, you may need three doses over 6 months. You may receive vaccines as individual doses or as more than one vaccine together in one shot (combination vaccines). Talk with your health care provider about the risks  and benefits of combination vaccines. What tests do I need? Blood tests  Lipid and cholesterol levels. These may be checked every 5 years, or more frequently if you are over 71 years old.  Hepatitis C test.  Hepatitis B test. Screening  Lung cancer screening. You may have this screening every year starting at age 21 if you have a 30-pack-year history of smoking and currently smoke or have quit within the past 15 years.  Prostate cancer screening. Recommendations will vary  depending on your family history and other risks.  Colorectal cancer screening. All adults should have this screening starting at age 80 and continuing until age 42. Your health care provider may recommend screening at age 108 if you are at increased risk. You will have tests every 1-10 years, depending on your results and the type of screening test.  Diabetes screening. This is done by checking your blood sugar (glucose) after you have not eaten for a while (fasting). You may have this done every 1-3 years.  Sexually transmitted disease (STD) testing. Follow these instructions at home: Eating and drinking  Eat a diet that includes fresh fruits and vegetables, whole grains, lean protein, and low-fat dairy products.  Take vitamin and mineral supplements as recommended by your health care provider.  Do not drink alcohol if your health care provider tells you not to drink.  If you drink alcohol: ? Limit how much you have to 0-2 drinks a day. ? Be aware of how much alcohol is in your drink. In the [place] one drink equals one 12 oz bottle of beer (355 mL), one 5 oz glass of wine (148 mL), or one 1 oz glass of hard liquor (44 mL). Lifestyle  Take daily care of your teeth and gums.  Stay active. Exercise for at least 30 minutes on 5 or more days each week.  Do not use any products that contain nicotine or tobacco, such as cigarettes, e-cigarettes, and chewing tobacco. If you need help quitting, ask your health care provider.  If you are sexually active, practice safe sex. Use a condom or other form of protection to prevent STIs (sexually transmitted infections).  Talk with your health care provider about taking a low-dose aspirin every day starting at age 72. What's next?  Go to your health care provider once a year for a well check visit.  Ask your health care provider how often you should have your eyes and teeth checked.  Stay up to date on all vaccines. This information is not  intended to replace advice given to you by your health care provider. Make sure you discuss any questions you have with your health care provider. Document Revised: 05/10/2018 Document Reviewed: 05/10/2018 Elsevier Patient Education  2020 [employer].

## 2020-01-08 NOTE — Assessment & Plan Note (Signed)
Advised him to follow-up with dermatologist soon - has new lesion on right anterior neck likely basal cell carcinoma.

## 2020-01-08 NOTE — Assessment & Plan Note (Signed)
Check A1c. 

## 2020-01-08 NOTE — Assessment & Plan Note (Signed)
Continue Flonase and Zyrtec as needed.

## 2020-01-09 LAB — COMPREHENSIVE METABOLIC PANEL
AG Ratio: 1.7 (calc) (ref 1.0–2.5)
ALT: 19 U/L (ref 9–46)
AST: 18 U/L (ref 10–35)
Albumin: 4.3 g/dL (ref 3.6–5.1)
Alkaline phosphatase (APISO): 67 U/L (ref 35–144)
BUN: 14 mg/dL (ref 7–25)
CO2: 30 mmol/L (ref 20–32)
Calcium: 10 mg/dL (ref 8.6–10.3)
Chloride: 102 mmol/L (ref 98–110)
Creat: 0.95 mg/dL (ref 0.70–1.25)
Globulin: 2.6 g/dL (calc) (ref 1.9–3.7)
Glucose, Bld: 107 mg/dL — ABNORMAL HIGH (ref 65–99)
Potassium: 4.8 mmol/L (ref 3.5–5.3)
Sodium: 138 mmol/L (ref 135–146)
Total Bilirubin: 0.8 mg/dL (ref 0.2–1.2)
Total Protein: 6.9 g/dL (ref 6.1–8.1)

## 2020-01-09 LAB — CBC
HCT: 48.5 % (ref 38.5–50.0)
Hemoglobin: 16.2 g/dL (ref 13.2–17.1)
MCH: 29.2 pg (ref 27.0–33.0)
MCHC: 33.4 g/dL (ref 32.0–36.0)
MCV: 87.5 fL (ref 80.0–100.0)
MPV: 10.2 fL (ref 7.5–12.5)
Platelets: 279 10*3/uL (ref 140–400)
RBC: 5.54 10*6/uL (ref 4.20–5.80)
RDW: 12.8 % (ref 11.0–15.0)
WBC: 6.5 10*3/uL (ref 3.8–10.8)

## 2020-01-09 LAB — LIPID PANEL
Cholesterol: 193 mg/dL (ref <200)
HDL: 47 mg/dL (ref >=40)
LDL Cholesterol (Calc): 125 mg/dL (calc) — ABNORMAL HIGH
Non-HDL Cholesterol (Calc): 146 mg/dL (calc) — ABNORMAL HIGH (ref <130)
Total CHOL/HDL Ratio: 4.1 (calc) (ref <5.0)
Triglycerides: 100 mg/dL (ref <150)

## 2020-01-09 LAB — HEMOGLOBIN A1C
Hgb A1c MFr Bld: 5.6 % of total Hgb (ref <5.7)
Mean Plasma Glucose: 114 (calc)
eAG (mmol/L): 6.3 (calc)

## 2020-01-09 LAB — TSH: TSH: 1.41 mIU/L (ref 0.40–4.50)

## 2020-01-09 LAB — PSA: PSA: 0.3 ng/mL (ref <=4.0)

## 2020-01-10 NOTE — Progress Notes (Signed)
Please inform patient of the following:  His "bad" cholesterol and blood sugar are borderline, but everything else is NORMAL. Would like for him to keep working on diet and exercise and we can recheck in a year.  Barry Thomas. Jerline Pain, MD 01/10/2020 9:38 AM

## 2020-07-01 ENCOUNTER — Ambulatory Visit: Payer: No Typology Code available for payment source

## 2021-01-08 ENCOUNTER — Encounter: Payer: No Typology Code available for payment source | Admitting: Family Medicine

## 2021-01-12 ENCOUNTER — Encounter: Payer: No Typology Code available for payment source | Admitting: Family Medicine

## 2021-02-22 ENCOUNTER — Encounter: Payer: No Typology Code available for payment source | Admitting: Family Medicine

## 2021-03-02 ENCOUNTER — Encounter: Payer: Self-pay | Admitting: Gastroenterology

## 2021-03-15 ENCOUNTER — Telehealth: Payer: Self-pay

## 2021-03-15 DIAGNOSIS — R739 Hyperglycemia, unspecified: Secondary | ICD-10-CM

## 2021-03-15 DIAGNOSIS — E785 Hyperlipidemia, unspecified: Secondary | ICD-10-CM

## 2021-03-15 DIAGNOSIS — Z0001 Encounter for general adult medical examination with abnormal findings: Secondary | ICD-10-CM

## 2021-03-15 DIAGNOSIS — Z125 Encounter for screening for malignant neoplasm of prostate: Secondary | ICD-10-CM

## 2021-03-15 NOTE — Telephone Encounter (Signed)
Patient states he has CPE on 10/18.  Would like to know if Jerline Pain could enter orders for him to complete labs before CPE.

## 2021-03-15 NOTE — Telephone Encounter (Signed)
Ok to order labs prior?

## 2021-03-15 NOTE — Telephone Encounter (Signed)
Ok to order same labs as last year.  Algis Greenhouse. Jerline Pain, MD 03/15/2021 9:35 AM

## 2021-03-15 NOTE — Telephone Encounter (Signed)
Labs ordered, ok to schedule pt to come in prior to CPE for labs.

## 2021-03-15 NOTE — Telephone Encounter (Signed)
Patient is scheduled   

## 2021-03-16 ENCOUNTER — Other Ambulatory Visit: Payer: Self-pay

## 2021-03-16 ENCOUNTER — Encounter: Payer: Self-pay | Admitting: Family Medicine

## 2021-03-16 ENCOUNTER — Ambulatory Visit (INDEPENDENT_AMBULATORY_CARE_PROVIDER_SITE_OTHER): Payer: No Typology Code available for payment source | Admitting: Family Medicine

## 2021-03-16 ENCOUNTER — Other Ambulatory Visit (INDEPENDENT_AMBULATORY_CARE_PROVIDER_SITE_OTHER): Payer: No Typology Code available for payment source

## 2021-03-16 VITALS — BP 121/73 | HR 79 | Temp 97.9°F | Ht 78.0 in | Wt 269.2 lb

## 2021-03-16 DIAGNOSIS — E785 Hyperlipidemia, unspecified: Secondary | ICD-10-CM | POA: Diagnosis not present

## 2021-03-16 DIAGNOSIS — Z0001 Encounter for general adult medical examination with abnormal findings: Secondary | ICD-10-CM

## 2021-03-16 DIAGNOSIS — Z1211 Encounter for screening for malignant neoplasm of colon: Secondary | ICD-10-CM | POA: Diagnosis not present

## 2021-03-16 DIAGNOSIS — R739 Hyperglycemia, unspecified: Secondary | ICD-10-CM

## 2021-03-16 DIAGNOSIS — E669 Obesity, unspecified: Secondary | ICD-10-CM

## 2021-03-16 DIAGNOSIS — Z125 Encounter for screening for malignant neoplasm of prostate: Secondary | ICD-10-CM | POA: Diagnosis not present

## 2021-03-16 DIAGNOSIS — Z6831 Body mass index (BMI) 31.0-31.9, adult: Secondary | ICD-10-CM

## 2021-03-16 LAB — COMPREHENSIVE METABOLIC PANEL
ALT: 19 U/L (ref 0–53)
AST: 16 U/L (ref 0–37)
Albumin: 4.2 g/dL (ref 3.5–5.2)
Alkaline Phosphatase: 62 U/L (ref 39–117)
BUN: 12 mg/dL (ref 6–23)
CO2: 29 mEq/L (ref 19–32)
Calcium: 9.6 mg/dL (ref 8.4–10.5)
Chloride: 102 mEq/L (ref 96–112)
Creatinine, Ser: 0.93 mg/dL (ref 0.40–1.50)
GFR: 88.28 mL/min (ref >60.00)
Glucose, Bld: 104 mg/dL — ABNORMAL HIGH (ref 70–99)
Potassium: 4.3 mEq/L (ref 3.5–5.1)
Sodium: 138 mEq/L (ref 135–145)
Total Bilirubin: 1 mg/dL (ref 0.2–1.2)
Total Protein: 6.6 g/dL (ref 6.0–8.3)

## 2021-03-16 LAB — CBC WITH DIFFERENTIAL/PLATELET
Basophils Absolute: 0 10*3/uL (ref 0.0–0.1)
Basophils Relative: 0.5 % (ref 0.0–3.0)
Eosinophils Absolute: 0.2 10*3/uL (ref 0.0–0.7)
Eosinophils Relative: 2.8 % (ref 0.0–5.0)
HCT: 45.6 % (ref 39.0–52.0)
Hemoglobin: 15.3 g/dL (ref 13.0–17.0)
Lymphocytes Relative: 25.2 % (ref 12.0–46.0)
Lymphs Abs: 1.9 10*3/uL (ref 0.7–4.0)
MCHC: 33.6 g/dL (ref 30.0–36.0)
MCV: 87.1 fl (ref 78.0–100.0)
Monocytes Absolute: 0.5 10*3/uL (ref 0.1–1.0)
Monocytes Relative: 6.6 % (ref 3.0–12.0)
Neutro Abs: 4.8 10*3/uL (ref 1.4–7.7)
Neutrophils Relative %: 64.9 % (ref 43.0–77.0)
Platelets: 252 10*3/uL (ref 150.0–400.0)
RBC: 5.23 Mil/uL (ref 4.22–5.81)
RDW: 13.8 % (ref 11.5–15.5)
WBC: 7.3 10*3/uL (ref 4.0–10.5)

## 2021-03-16 LAB — LIPID PANEL
Cholesterol: 171 mg/dL (ref 0–200)
HDL: 43.2 mg/dL (ref >39.00)
LDL Cholesterol: 102 mg/dL — ABNORMAL HIGH (ref 0–99)
NonHDL: 128.2
Total CHOL/HDL Ratio: 4
Triglycerides: 129 mg/dL (ref 0.0–149.0)
VLDL: 25.8 mg/dL (ref 0.0–40.0)

## 2021-03-16 LAB — PSA: PSA: 0.45 ng/mL (ref 0.10–4.00)

## 2021-03-16 LAB — TSH: TSH: 1.53 u[IU]/mL (ref 0.35–5.50)

## 2021-03-16 LAB — HEMOGLOBIN A1C: Hgb A1c MFr Bld: 5.9 % (ref 4.6–6.5)

## 2021-03-16 NOTE — Assessment & Plan Note (Signed)
Last LDL 102.  Continue lifestyle modifications.

## 2021-03-16 NOTE — Progress Notes (Signed)
Chief Complaint:  Barry Thomas is a 60 y.o. male who presents today for his annual comprehensive physical exam.    Assessment/Plan:  New/Acute Problems: Abdominal Pain Possibly due to constipation.  No red flag signs or symptoms.  We will continue with watchful waiting.  He will let me know if not improving.  Elevated blood pressure reading Initially elevated in the 150s over 70s.  On recheck was back to normal.  Discussed home blood pressure monitoring.  Discussed lifestyle modifications.  Chronic Problems Addressed Today: Dyslipidemia Last LDL 102.  Continue lifestyle modifications.  Hyperglycemia Last A1c 5.8.  Continue lifestyle modifications.   Body mass index is 31.11 kg/m. / Obese  BMI Metric Follow Up - 03/16/21 1455       BMI Metric Follow Up-Please document annually   BMI Metric Follow Up Education provided              Preventative Healthcare: UTD on vaccines. Due for colonoscopy this year. Had labs this morning.   Patient Counseling(The following topics were reviewed and/or handout was given):  -Nutrition: Stressed importance of moderation in sodium/caffeine intake, saturated fat and cholesterol, caloric balance, sufficient intake of fresh fruits, vegetables, and fiber.  -Stressed the importance of regular exercise.   -Substance Abuse: Discussed cessation/primary prevention of tobacco, alcohol, or other drug use; driving or other dangerous activities under the influence; availability of treatment for abuse.   -Injury prevention: Discussed safety belts, safety helmets, smoke detector, smoking near bedding or upholstery.   -Sexuality: Discussed sexually transmitted diseases, partner selection, use of condoms, avoidance of unintended pregnancy and contraceptive alternatives.   -Dental health: Discussed importance of regular tooth brushing, flossing, and dental visits.  -Health maintenance and immunizations reviewed. Please refer to Health maintenance  section.  Return to care in 1 year for next preventative visit.     Subjective:  HPI:  He has no acute complaints today.   He is here with abdominal pain in the right lower quadrant. This started about a week ago.  He described pain more as a discomfort. He has had stiffness in the area. He notes bowel movements are normal. Denies, fever, nausea or vomiting. We will get CT scan if symptoms did not improve.   His blood pressure at office was elevated today. He does not check his blood pressure at home.   Lifestyle Diet: Balanced Exercise: Trying to get back on exercise.  Depression screen PHQ 2/9 03/16/2021  Decreased Interest 0  Down, Depressed, Hopeless 0  PHQ - 2 Score 0    Health Maintenance Due  Topic Date Due   COLONOSCOPY (Pts 45-14yrs Insurance coverage will need to be confirmed)  01/05/2021     ROS: Per HPI, otherwise a complete review of systems was negative.   PMH:  The following were reviewed and entered/updated in epic: Past Medical History:  Diagnosis Date   Basal cell cancer 2008, 2010   leg and face   Pancreatitis    mid 2000s due to parasite   Seasonal allergies    Patient Active Problem List   Diagnosis Date Noted   Hyperglycemia 01/03/2019   Dyslipidemia 01/03/2019   PVC's (premature ventricular contractions) 10/02/2017   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 03/18/2010   Allergic rhinitis 03/16/2010   CARCINOMA, BASAL CELL, HX OF 03/16/2010   Past Surgical History:  Procedure Laterality Date   removal basal cell  2008, 2010   face and leg    Family History  Problem Relation Age  of Onset   Psoriasis Mother    Hypertension Mother    Atrial fibrillation Mother    Diabetes Father    Cancer Father    Hypertension Father    Cancer Sister    Melanoma Sister    Colon cancer Neg Hx    Esophageal cancer Neg Hx    Stomach cancer Neg Hx     Medications- reviewed and updated Current Outpatient Medications  Medication Sig Dispense  Refill   cetirizine (ZYRTEC) 10 MG tablet Take 10 mg by mouth daily.     fluticasone (FLONASE) 50 MCG/ACT nasal spray Place into both nostrils daily.     latanoprost (XALATAN) 0.005 % ophthalmic solution SMARTSIG:In Eye(s)     No current facility-administered medications for this visit.    Allergies-reviewed and updated Allergies  Allergen Reactions   Aspirin Swelling    Stomach cramps   Nsaids     REACTION: swelling and stomach cramps    Social History   Socioeconomic History   Marital status: Married    Spouse name: Not on file   Number of children: 0   Years of education: Not on file   Highest education level: Not on file  Occupational History   Occupation: Engineer, maintenance (IT) CONTROLLER    Employer: [employer]  Tobacco Use   Smoking status: Never   Smokeless tobacco: Never  Substance and Sexual Activity   Alcohol use: No   Drug use: No   Sexual activity: Yes    Birth control/protection: Condom  Other Topics Concern   Not on file  Social History Narrative   Not on file   Social Determinants of Health   Financial Resource Strain: Not on file  Food Insecurity: Not on file  Transportation Needs: Not on file  Physical Activity: Not on file  Stress: Not on file  Social Connections: Not on file        Objective:  Physical Exam: BP 121/73   Pulse 79   Temp 97.9 F (36.6 C)   Ht 6\' 6"  (1.981 m)   Wt 269 lb 3.2 oz (122.1 kg)   SpO2 97%   BMI 31.11 kg/m   Body mass index is 31.11 kg/m. Wt Readings from Last 3 Encounters:  03/16/21 269 lb 3.2 oz (122.1 kg)  01/08/20 264 lb 3.2 oz (119.8 kg)  01/03/19 263 lb 9.6 oz (119.6 kg)   Gen: NAD, resting comfortably HEENT: TMs normal bilaterally. OP clear. No thyromegaly noted.  CV: RRR with no murmurs appreciated Pulm: NWOB, CTAB with no crackles, wheezes, or rhonchi GI: Normal bowel sounds present. Soft, Nontender, Nondistended. MSK: no edema, cyanosis, or clubbing noted Skin: warm, dry Neuro: CN2-12 grossly  intact. Strength 5/5 in upper and lower extremities. Reflexes symmetric and intact bilaterally.  Psych: Normal affect and thought content      I,Savera Zaman,acting as a scribe for Dimas Chyle, MD.,have documented all relevant documentation on the behalf of Dimas Chyle, MD,as directed by  Dimas Chyle, MD while in the presence of Dimas Chyle, MD.   I, Dimas Chyle, MD, have reviewed all documentation for this visit. The documentation on 03/16/21 for the exam, diagnosis, procedures, and orders are all accurate and complete.  Algis Greenhouse. Jerline Pain, MD 03/16/2021 2:55 PM

## 2021-03-16 NOTE — Patient Instructions (Signed)
It was very nice to see you today!  Please keep your blood pressure and let me know if it is persistently 150/90 or higher.  Your labs are all STABLE.  Please let me know if your abdominal Thomas is not improved.  We will see back in 1 year for your next physical.  Come back sooner if needed.  Take care, Dr Barry Thomas  PLEASE NOTE:  If you had any lab tests please let us know if you have not heard back within a few days. You may see your results on mychart before we have a chance to review them but we will give you a call once they are reviewed by [place]. If we ordered any referrals today, please let us know if you have not heard from their office within the next week.   Please try these tips to maintain a healthy lifestyle:  Eat at least 3 REAL meals and 1-2 snacks per day.  Aim for no more than 5 hours between eating.  If you eat breakfast, please do so within one hour of getting up.   Each meal should contain half fruits/vegetables, one quarter protein, and one quarter carbs (no bigger than a computer mouse)  Cut down on sweet beverages. This includes juice, soda, and sweet tea.   Drink at least 1 glass of water with each meal and aim for at least 8 glasses per day  Exercise at least 150 minutes every week.    Preventive Care 24-37 Years Old, Male Preventive care refers to lifestyle choices and visits with your health care provider that can promote health and wellness. This includes: A yearly physical exam. This is also called an annual wellness visit. Regular dental and eye exams. Immunizations. Screening for certain conditions. Healthy lifestyle choices, such as: Eating a healthy diet. Getting regular exercise. Not using drugs or products that contain nicotine and tobacco. Limiting alcohol use. What can I expect for my preventive care visit? Physical exam Your health care provider will check your: Height and weight. These may be used to calculate your BMI (body mass index). BMI  is a measurement that tells if you are at a healthy weight. Heart rate and blood pressure. Body temperature. Skin for abnormal spots. Counseling Your health care provider may ask you questions about your: Past medical problems. Family's medical history. Alcohol, tobacco, and drug use. Emotional well-being. Home life and relationship well-being. Sexual activity. Diet, exercise, and sleep habits. Work and work Statistician. Access to firearms. What immunizations do I need? Vaccines are usually given at various ages, according to a schedule. Your health care provider will recommend vaccines for you based on your age, medical history, and lifestyle or other factors, such as travel or where you work. What tests do I need? Blood tests Lipid and cholesterol levels. These may be checked every 5 years, or more often if you are over 79 years old. Hepatitis C test. Hepatitis B test. Screening Lung cancer screening. You may have this screening every year starting at age 62 if you have a 30-pack-year history of smoking and currently smoke or have quit within the past 15 years. Prostate cancer screening. Recommendations will vary depending on your family history and other risks. Genital exam to check for testicular cancer or hernias. Colorectal cancer screening. All adults should have this screening starting at age 67 and continuing until age 51. Your health care provider may recommend screening at age 43 if you are at increased risk. You will have tests every  1-10 years, depending on your results and the type of screening test. Diabetes screening. This is done by checking your blood sugar (glucose) after you have not eaten for a while (fasting). You may have this done every 1-3 years. STD (sexually transmitted disease) testing, if you are at risk. Follow these instructions at home: Eating and drinking  Eat a diet that includes fresh fruits and vegetables, whole grains, lean protein, and  low-fat dairy products. Take vitamin and mineral supplements as recommended by your health care provider. Do not drink alcohol if your health care provider tells you not to drink. If you drink alcohol: Limit how much you have to 0-2 drinks a day. Be aware of how much alcohol is in your drink. In the [place] one drink equals one 12 oz bottle of beer (355 mL), one 5 oz glass of wine (148 mL), or one 1 oz glass of hard liquor (44 mL). Lifestyle Take daily care of your teeth and gums. Brush your teeth every morning and night with fluoride toothpaste. Floss one time each day. Stay active. Exercise for at least 30 minutes 5 or more days each week. Do not use any products that contain nicotine or tobacco, such as cigarettes, e-cigarettes, and chewing tobacco. If you need help quitting, ask your health care provider. Do not use drugs. If you are sexually active, practice safe sex. Use a condom or other form of protection to prevent STIs (sexually transmitted infections). If told by your health care provider, take low-dose aspirin daily starting at age 48. Find healthy ways to cope with stress, such as: Meditation, yoga, or listening to music. Journaling. Talking to a trusted person. Spending time with friends and family. Safety Always wear your seat belt while driving or riding in a vehicle. Do not drive: If you have been drinking alcohol. Do not ride with someone who has been drinking. When you are tired or distracted. While texting. Wear a helmet and other protective equipment during sports activities. If you have firearms in your house, make sure you follow all gun safety procedures. What's next? Go to your health care provider once a year for an annual wellness visit. Ask your health care provider how often you should have your eyes and teeth checked. Stay up to date on all vaccines. This information is not intended to replace advice given to you by your health care provider. Make sure you  discuss any questions you have with your health care provider. Document Revised: 07/24/2020 Document Reviewed: 05/10/2018 Elsevier Patient Education  2022 [employer].

## 2021-03-16 NOTE — Assessment & Plan Note (Signed)
Last A1c 5.8.  Continue lifestyle modifications.

## 2021-03-30 ENCOUNTER — Encounter (INDEPENDENT_AMBULATORY_CARE_PROVIDER_SITE_OTHER): Payer: Self-pay

## 2021-03-30 ENCOUNTER — Encounter: Payer: Self-pay | Admitting: Gastroenterology

## 2021-04-05 ENCOUNTER — Encounter (INDEPENDENT_AMBULATORY_CARE_PROVIDER_SITE_OTHER): Payer: No Typology Code available for payment source | Admitting: Ophthalmology

## 2021-04-05 ENCOUNTER — Ambulatory Visit (INDEPENDENT_AMBULATORY_CARE_PROVIDER_SITE_OTHER): Payer: No Typology Code available for payment source | Admitting: Ophthalmology

## 2021-04-05 ENCOUNTER — Other Ambulatory Visit: Payer: Self-pay

## 2021-04-05 ENCOUNTER — Encounter (INDEPENDENT_AMBULATORY_CARE_PROVIDER_SITE_OTHER): Payer: Self-pay | Admitting: Ophthalmology

## 2021-04-05 DIAGNOSIS — H43392 Other vitreous opacities, left eye: Secondary | ICD-10-CM | POA: Diagnosis not present

## 2021-04-05 DIAGNOSIS — H43821 Vitreomacular adhesion, right eye: Secondary | ICD-10-CM | POA: Insufficient documentation

## 2021-04-05 DIAGNOSIS — H43812 Vitreous degeneration, left eye: Secondary | ICD-10-CM | POA: Diagnosis not present

## 2021-04-05 NOTE — Assessment & Plan Note (Signed)
Physiologic OD 

## 2021-04-05 NOTE — Assessment & Plan Note (Signed)

## 2021-04-05 NOTE — Assessment & Plan Note (Signed)
OS with vitreous debris floaters occasionally hampering patient's activities, not severe

## 2021-04-05 NOTE — Progress Notes (Signed)
04/05/2021     CHIEF COMPLAINT Patient presents for  Chief Complaint  Patient presents with   Retina Evaluation      HISTORY OF PRESENT ILLNESS: Barry Thomas is a 60 y.o. male who presents to the clinic today for:   HPI     Retina Evaluation   In both eyes.  This started 1 week ago.  Duration of 1 week.  Associated Symptoms Floaters (Longstanding, OU.).  Negative for Flashes, Distortion and Blind Spot.        Comments   NP- left non retinal detachment- referred by Mart Piggs. "I have had floaters for the last two years, but I have had them in the left eye significantly more recently." Pt uses latanoprost qhs OU, pt states he has been using these drops for ~1 year. Pt denies FOL. Dr. Nicki Reaper note states "round hole, left" under diagnosis.      Last edited by Laurin Coder on 04/05/2021  2:50 PM.      Referring physician: Macarthur Critchley, [address] [place],  [place] [address]  HISTORICAL INFORMATION:   Selected notes from the MEDICAL RECORD NUMBER    Lab Results  Component Value Date   HGBA1C 5.9 03/16/2021     CURRENT MEDICATIONS: Current Outpatient Medications (Ophthalmic Drugs)  Medication Sig   latanoprost (XALATAN) 0.005 % ophthalmic solution SMARTSIG:In Eye(s)   No current facility-administered medications for this visit. (Ophthalmic Drugs)   Current Outpatient Medications (Other)  Medication Sig   cetirizine (ZYRTEC) 10 MG tablet Take 10 mg by mouth daily.   fluticasone (FLONASE) 50 MCG/ACT nasal spray Place into both nostrils daily.   No current facility-administered medications for this visit. 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 due to parasite   Seasonal  allergies    Past Surgical History:  Procedure Laterality Date   removal basal cell  2008, 2010   face and leg    FAMILY HISTORY Family History  Problem Relation Age of Onset   Psoriasis Mother    Hypertension Mother    Atrial fibrillation Mother    Diabetes Father    Cancer Father    Hypertension Father    Cancer Sister    Melanoma Sister    Colon cancer Neg Hx    Esophageal cancer Neg Hx    Stomach cancer Neg Hx     SOCIAL HISTORY Social History   Tobacco Use   Smoking status: Never   Smokeless tobacco: Never  Substance Use Topics   Alcohol use: No   Drug use: No         OPHTHALMIC EXAM:  Base Eye Exam     Visual Acuity (ETDRS)       Right Left   Dist cc 20/20 20/20    Correction: Glasses         Tonometry (Tonopen, 2:52 PM)       Right Left   Pressure 14 19         Pupils       Pupils Dark Light Shape React APD   Right PERRL 6 5 Round Brisk None  Left PERRL 6 5 Round Brisk None         Visual Fields (Counting fingers)       Left Right    Full Full         Extraocular Movement       Right Left    Full Full         Neuro/Psych     Oriented x3: Yes   Mood/Affect: Normal         Dilation     Both eyes: 1.0% Mydriacyl, 2.5% Phenylephrine @ 2:52 PM           Slit Lamp and Fundus Exam     External Exam       Right Left   External Normal Normal         Slit Lamp Exam       Right Left   Lids/Lashes Normal Normal   Conjunctiva/Sclera White and quiet White and quiet   Cornea Clear Clear   Anterior Chamber Deep and quiet Deep and quiet   Iris Round and reactive Round and reactive   Lens 1+ Nuclear sclerosis 1+ Nuclear sclerosis   Anterior Vitreous Normal Normal         Fundus Exam       Right Left   Posterior Vitreous Normal Posterior vitreous detachment   Disc Normal Normal   C/D Ratio 0.15 0.15   Macula Normal Normal   Vessels Normal Normal, normal retinal vasculature and no occlusion    Periphery Normal, no holes or tears no retinal hole, no retinal tear, scleral depression 20 and 28 diopter examination 360, x2 exam            IMAGING AND PROCEDURES  Imaging and Procedures for 04/14/21  OCT, Retina - OU - Both Eyes       Right Eye Quality was good. Scan locations included subfoveal. Central Foveal Thickness: 299. Progression has no prior data. Findings include normal foveal contour, normal observations, vitreomacular adhesion .   Left Eye Quality was good. Scan locations included subfoveal. Central Foveal Thickness: 275. Progression has no prior data. Findings include normal observations, normal foveal contour.   Notes   OS with incidental PVD no significant shadowing effect  OD with the VMA, normal contour OU     Color Fundus Photography Optos - OU - Both Eyes       Right Eye Progression has no prior data. Disc findings include normal observations. Macula : normal observations. Vessels : normal observations. Periphery : normal observations.   Left Eye Progression has no prior data. Disc findings include normal observations. Macula : normal observations. Vessels : normal observations. Periphery : normal observations.              ASSESSMENT/PLAN:  Vitreous floaters, left OS with vitreous debris floaters occasionally hampering patient's activities, not severe  Posterior vitreous detachment, left eye  The nature of posterior vitreous detachment was discussed with the patient as well as its physiology, its age prevalence, and its possible implication regarding retinal breaks and detachment.  An informational brochure was offered to the patient.  All the patient's questions were answered.  The patient was asked to return if new or different flashes or floaters develops.   Patient was instructed to contact office immediately if any new changes were noticed. I explained to the patient that vitreous inside the eye is similar to jello inside a bowl. As  the jello melts it can start to pull away from the  bowl, similarly the vitreous throughout our lives can begin to pull away from the retina. That process is called a posterior vitreous detachment. In some cases, the vitreous can tug hard enough on the retina to form a retinal tear. I discussed with the patient the signs and symptoms of a retinal detachment.  Do not rub the eye.    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 Vitreomacular adhesion of right eye  H43.821 OCT, Retina - OU - Both Eyes    Color Fundus Photography Optos - OU - Both Eyes    2. Posterior vitreous detachment, left eye  H43.812 OCT, Retina - OU - Both Eyes    Color Fundus Photography Optos - OU - Both Eyes    3. Vitreous floaters, left  H43.392       1.  OS with posterior vitreous detachment, no retinal holes or tears with 360 scleral depressed exam with 20 and 28 diopter lenses  2.  OD with physiologic VMA, no PVD today  3.  Ophthalmic Meds Ordered this visit:  No orders of the defined types were placed in this encounter.      Return in about 1 year (around 04/05/2022) for DILATE OU, COLOR FP, OCT.  There are no Patient Instructions on file for this visit.   Explained the diagnoses, plan, and follow up with the patient and they expressed understanding.  Patient expressed understanding of the importance of proper follow up care.   Clent Demark Anzley Dibbern M.D. Diseases & Surgery of the Retina and Vitreous [facility]ina [facility] Diabetic [place] 04/14/21     Abbreviations: M myopia (nearsighted); A astigmatism; H hyperopia (farsighted); P presbyopia; Mrx spectacle prescription;  CTL contact lenses; OD right eye; OS left eye; OU both eyes  XT exotropia; ET esotropia; PEK punctate epithelial keratitis; PEE punctate epithelial erosions; DES dry eye syndrome; MGD meibomian gland dysfunction; ATs artificial tears; PFAT's preservative free artificial tears; [place] nuclear sclerotic cataract; PSC  posterior subcapsular cataract; ERM epi-retinal membrane; PVD posterior vitreous detachment; RD retinal detachment; DM diabetes mellitus; DR diabetic retinopathy; NPDR non-proliferative diabetic retinopathy; PDR proliferative diabetic retinopathy; CSME clinically significant macular edema; DME diabetic macular edema; dbh dot blot hemorrhages; CWS cotton wool spot; POAG primary open angle glaucoma; C/D cup-to-disc ratio; HVF humphrey visual field; GVF goldmann visual field; OCT optical coherence tomography; IOP intraocular pressure; BRVO Branch retinal vein occlusion; CRVO central retinal vein occlusion; CRAO central retinal artery occlusion; BRAO branch retinal artery occlusion; RT retinal tear; SB scleral buckle; PPV pars plana vitrectomy; VH Vitreous hemorrhage; PRP panretinal laser photocoagulation; IVK intravitreal kenalog; VMT vitreomacular traction; MH Macular hole;  NVD neovascularization of the disc; NVE neovascularization elsewhere; AREDS age related eye disease study; ARMD age related macular degeneration; POAG primary open angle glaucoma; EBMD epithelial/anterior basement membrane dystrophy; ACIOL anterior chamber intraocular lens; IOL intraocular lens; PCIOL posterior chamber intraocular lens; Phaco/IOL phacoemulsification with intraocular lens placement; [place] photorefractive keratectomy; LASIK laser assisted in situ keratomileusis; HTN hypertension; DM diabetes mellitus; COPD chronic obstructive pulmonary disease

## 2021-05-12 ENCOUNTER — Other Ambulatory Visit: Payer: Self-pay

## 2021-05-12 ENCOUNTER — Ambulatory Visit (AMBULATORY_SURGERY_CENTER): Payer: No Typology Code available for payment source

## 2021-05-12 VITALS — Ht 78.0 in | Wt 260.0 lb

## 2021-05-12 DIAGNOSIS — Z1211 Encounter for screening for malignant neoplasm of colon: Secondary | ICD-10-CM

## 2021-05-12 NOTE — Progress Notes (Signed)

## 2021-06-01 ENCOUNTER — Encounter: Payer: Self-pay | Admitting: Gastroenterology

## 2021-06-02 ENCOUNTER — Ambulatory Visit (AMBULATORY_SURGERY_CENTER): Payer: No Typology Code available for payment source | Admitting: Gastroenterology

## 2021-06-02 ENCOUNTER — Encounter: Payer: Self-pay | Admitting: Gastroenterology

## 2021-06-02 VITALS — BP 118/76 | HR 52 | Temp 97.3°F | Resp 15 | Ht 78.0 in | Wt 260.0 lb

## 2021-06-02 DIAGNOSIS — D124 Benign neoplasm of descending colon: Secondary | ICD-10-CM

## 2021-06-02 DIAGNOSIS — D125 Benign neoplasm of sigmoid colon: Secondary | ICD-10-CM | POA: Diagnosis not present

## 2021-06-02 DIAGNOSIS — Z800 Family history of malignant neoplasm of digestive organs: Secondary | ICD-10-CM | POA: Diagnosis not present

## 2021-06-02 DIAGNOSIS — Z1211 Encounter for screening for malignant neoplasm of colon: Secondary | ICD-10-CM | POA: Diagnosis present

## 2021-06-02 MED ORDER — SODIUM CHLORIDE 0.9 % IV SOLN: 500.0000 mL | Freq: Once | INTRAVENOUS | Status: DC

## 2021-06-02 NOTE — Progress Notes (Signed)
VS- Barry Thomas  Pt's states no medical or surgical changes since previsit or office visit.  

## 2021-06-02 NOTE — Op Note (Signed)
[facility] Patient Name: Barry Thomas Procedure Date: 06/02/2021 10:06 AM MRN: [id number] Endoscopist: Justice Britain , MD Age: 61 Referring MD:  Date of Birth: 1961-05-25 Gender: Male Account #: [id number] Procedure:                Colonoscopy Indications:              Screening in patient at increased risk: Family                            history of 1st-degree relative (Brother) with                            colorectal cancer Medicines:                Monitored Anesthesia Care Procedure:                Pre-Anesthesia Assessment:                           - Prior to the procedure, a History and Physical                            was performed, and patient medications and                            allergies were reviewed. The patient's tolerance of                            previous anesthesia was also reviewed. The risks                            and benefits of the procedure and the sedation                            options and risks were discussed with the patient.                            All questions were answered, and informed consent                            was obtained. Prior Anticoagulants: The patient has                            taken no previous anticoagulant or antiplatelet                            agents. ASA Grade Assessment: II - A patient with                            mild systemic disease. After reviewing the risks                            and benefits, the patient was deemed in  satisfactory condition to undergo the procedure.                           After obtaining informed consent, the colonoscope                            was passed under direct vision. Throughout the                            procedure, the patient's blood pressure, pulse, and                            oxygen saturations were monitored continuously. The                            Olympus CF-HQ190L [id number]) Colonoscope was                             introduced through the anus and advanced to the 5                            cm into the ileum. The colonoscopy was performed                            without difficulty. The patient tolerated the                            procedure. The quality of the bowel preparation was                            good. The terminal ileum, ileocecal valve,                            appendiceal orifice, and rectum were photographed. Scope In: 10:16:51 AM Scope Out: 10:38:37 AM Scope Withdrawal Time: 0 hours 17 minutes 23 seconds  Total Procedure Duration: 0 hours 21 minutes 46 seconds  Findings:                 The digital rectal exam findings include                            hemorrhoids. Pertinent negatives include no                            palpable rectal lesions.                           The terminal ileum and ileocecal valve appeared                            normal.                           Five sessile polyps were found in the sigmoid colon                            (  4) and descending colon (1). The polyps were 2 to                            7 mm in size. These polyps were removed with a cold                            snare. Resection and retrieval were complete.                           Normal mucosa was found in the entire colon                            otherwise.                           Non-bleeding non-thrombosed external and internal                            hemorrhoids were found during retroflexion, during                            perianal exam and during digital exam. The                            hemorrhoids were Grade II (internal hemorrhoids                            that prolapse but reduce spontaneously). Complications:            No immediate complications. Estimated Blood Loss:     Estimated blood loss was minimal. Impression:               - Hemorrhoids found on digital rectal exam.                           - The examined portion  of the ileum was normal.                           - Five 2 to 7 mm polyps in the sigmoid colon and in                            the descending colon, removed with a cold snare.                            Resected and retrieved.                           - Normal mucosa in the entire examined colon                            othewise.                           - Non-bleeding non-thrombosed external and internal  hemorrhoids. Recommendation:           - The patient will be observed post-procedure,                            until all discharge criteria are met.                           - Discharge patient to home.                           - Patient has a contact number available for                            emergencies. The signs and symptoms of potential                            delayed complications were discussed with the                            patient. Return to normal activities tomorrow.                            Written discharge instructions were provided to the                            patient.                           - High fiber diet.                           - Use FiberCon 1-2 tablets PO daily.                           - Continue present medications.                           - Await pathology results.                           - Repeat colonoscopy in 3 years for surveillance.                            With patient's family history of brother with colon                            cancer, will always need to be on at least a 5-year                            plan, though with number of polyps found today,                            will be a 3-year follow up for the next colonoscopy.                           -  The findings and recommendations were discussed                            with the patient.                           - The findings and recommendations were discussed                            with the patient's  family. Justice Britain, MD 06/02/2021 10:43:34 AM

## 2021-06-02 NOTE — Patient Instructions (Signed)
Discharge instructions given. ?Handouts on polyps and Hemorrhoids. ?Resume previous medications. ?YOU HAD AN ENDOSCOPIC PROCEDURE TODAY AT THE [facility]:   Refer to the procedure report that was given to you for any specific questions about what was found during the examination.  If the procedure report does not answer your questions, please call your gastroenterologist to clarify.  If you requested that your care partner not be given the details of your procedure findings, then the procedure report has been included in a sealed envelope for you to review at your convenience later. ? ?YOU SHOULD EXPECT: Some feelings of bloating in the abdomen. Passage of more gas than usual.  Walking can help get rid of the air that was put into your GI tract during the procedure and reduce the bloating. If you had a lower endoscopy (such as a colonoscopy or flexible sigmoidoscopy) you may notice spotting of blood in your stool or on the toilet paper. If you underwent a bowel prep for your procedure, you may not have a normal bowel movement for a few days. ? ?Please Note:  You might notice some irritation and congestion in your nose or some drainage.  This is from the oxygen used during your procedure.  There is no need for concern and it should clear up in a day or so. ? ?SYMPTOMS TO REPORT IMMEDIATELY: ? ?Following lower endoscopy (colonoscopy or flexible sigmoidoscopy): ? Excessive amounts of blood in the stool ? Significant tenderness or worsening of abdominal pains ? Swelling of the abdomen that is new, acute ? Fever of 100?F or higher ? ? ?For urgent or emergent issues, a gastroenterologist can be reached at any hour by calling [phone]. ?Do not use MyChart messaging for urgent concerns.  ? ? ?DIET:  We do recommend a small meal at first, but then you may proceed to your regular diet.  Drink plenty of fluids but you should avoid alcoholic beverages for 24 hours. ? ?ACTIVITY:  You should plan to take it  easy for the rest of today and you should NOT DRIVE or use heavy machinery until tomorrow (because of the sedation medicines used during the test).   ? ?FOLLOW UP: ?Our staff will call the number listed on your records 48-72 hours following your procedure to check on you and address any questions or concerns that you may have regarding the information given to you following your procedure. If we do not reach you, we will leave a message.  We will attempt to reach you two times.  During this call, we will ask if you have developed any symptoms of COVID 19. If you develop any symptoms (ie: fever, flu-like symptoms, shortness of breath, cough etc.) before then, please call [phone].  If you test positive for Covid 19 in the 2 weeks post procedure, please call and report this information to us.   ? ?If any biopsies were taken you will be contacted by phone or by letter within the next 1-3 weeks.  Please call us at [phone] if you have not heard about the biopsies in 3 weeks.  ? ? ?SIGNATURES/CONFIDENTIALITY: ?You and/or your care partner have signed paperwork which will be entered into your electronic medical record.  These signatures attest to the fact that that the information above on your After Visit Summary has been reviewed and is understood.  Full responsibility of the confidentiality of this discharge information lies with you and/or your care-partner.  ?

## 2021-06-02 NOTE — Progress Notes (Signed)
GASTROENTEROLOGY PROCEDURE H&P NOTE   Primary Care Physician: Vivi Barrack, MD  HPI: Barry Thomas is a 61 y.o. male who presents for Colonoscopy for screening.  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     Cholecalciferol (VITAMIN D3 PO) Take 1 tablet by mouth daily at 6 (six) AM.     Flaxseed, Linseed, (FLAX SEED OIL PO) Take 1 capsule by mouth daily at 6 (six) AM.     fluticasone (FLONASE) 50 MCG/ACT nasal spray Place into both nostrils daily.     latanoprost (XALATAN) 0.005 % ophthalmic solution Place 1 drop into both eyes at bedtime.     Multiple Vitamin (MULTIVITAMIN PO) Take 1 tablet by mouth daily at 6 (six) AM.     Nutritional Supplements (WELLNESS ESSENTIALS PO) Take 1 capsule by mouth daily. Increases up to 4 capsules daily PRN     vitamin E 180 MG (400 UNITS) capsule Take 400 Units by mouth daily.     Turmeric (QC TUMERIC COMPLEX PO) Take 1 capsule by mouth daily at 6 (six) AM.     Current Facility-Administered Medications  Medication Dose Route Frequency Provider Last Rate Last Admin   0.9 %  sodium chloride infusion  500 mL Intravenous Once Mansouraty, Telford Nab., MD        Current Outpatient Medications:    cetirizine (ZYRTEC) 10 MG tablet, Take 10 mg by mouth daily as needed., Disp: , Rfl:    Cholecalciferol (VITAMIN D3 PO), Take 1 tablet by mouth daily at 6 (six) AM., Disp: , Rfl:     Flaxseed, Linseed, (FLAX SEED OIL PO), Take 1 capsule by mouth daily at 6 (six) AM., Disp: , Rfl:    fluticasone (FLONASE) 50 MCG/ACT nasal spray, Place into both nostrils daily., Disp: , Rfl:    latanoprost (XALATAN) 0.005 % ophthalmic solution, Place 1 drop into both eyes at bedtime., Disp: , Rfl:    Multiple Vitamin (MULTIVITAMIN PO), Take 1 tablet by mouth daily at 6 (six) AM., Disp: , Rfl:    Nutritional Supplements (WELLNESS ESSENTIALS PO), Take 1 capsule by mouth daily. Increases up to 4 capsules daily PRN, Disp: , Rfl:    vitamin E 180 MG (400 UNITS) capsule, Take 400 Units by mouth daily., Disp: , Rfl:    Turmeric (QC TUMERIC COMPLEX PO), Take 1 capsule by mouth daily at 6 (six) AM., Disp: , Rfl:   Current Facility-Administered Medications:    0.9 %  sodium chloride infusion, 500 mL, Intravenous, Once, Mansouraty, Telford Nab., MD Allergies  Allergen Reactions   Aspirin Swelling    Stomach cramps   Nsaids     REACTION: swelling and stomach cramps   Family History  Problem Relation Age of Onset   Psoriasis Mother    Hypertension Mother  Atrial fibrillation Mother    Diabetes Father    Cancer Father    Hypertension Father    Cancer Sister    Melanoma Sister    Colon cancer Brother 5   Colon polyps Brother 42   Esophageal cancer Neg Hx    Stomach cancer Neg Hx    Rectal cancer Neg Hx    Social History   Socioeconomic History   Marital status: Married    Spouse name: Not on file   Number of children: 0   Years of education: Not on file   Highest education level: Not on file  Occupational History   Occupation: Engineer, maintenance (IT) CONTROLLER    Employer: [employer]  Tobacco Use   Smoking status: Never   Smokeless tobacco: Never  Vaping Use   Vaping Use: Never used  Substance and Sexual Activity   Alcohol use: No   Drug use: No   Sexual activity: Yes    Birth control/protection: Condom  Other Topics Concern   Not on file  Social History Narrative   Not on file    Social Determinants of Health   Financial Resource Strain: Not on file  Food Insecurity: Not on file  Transportation Needs: Not on file  Physical Activity: Not on file  Stress: Not on file  Social Connections: Not on file  Intimate Partner Violence: Not on file    Physical Exam: Today's Vitals   06/02/21 0906  BP: 134/65  Pulse: 70  Temp: (!) 97.3 F (36.3 C)  TempSrc: Temporal  SpO2: 98%  Weight: 260 lb (117.9 kg)  Height: 6\' 6"  (1.981 m)   Body mass index is 30.05 kg/m. GEN: NAD EYE: Sclerae anicteric ENT: MMM CV: Non-tachycardic GI: Soft, NT/ND NEURO:  Alert & Oriented x 3  Lab Results: No results for input(s): WBC, HGB, HCT, PLT in the last 72 hours. BMET No results for input(s): NA, K, CL, CO2, GLUCOSE, BUN, CREATININE, CALCIUM in the last 72 hours. LFT No results for input(s): PROT, ALBUMIN, AST, ALT, ALKPHOS, BILITOT, BILIDIR, IBILI in the last 72 hours. PT/INR No results for input(s): LABPROT, INR in the last 72 hours.   Impression / Plan: This is a 61 y.o.male who presents for Colonoscopy for screening.  The risks and benefits of endoscopic evaluation/treatment were discussed with the patient and/or family; these include but are not limited to the risk of perforation, infection, bleeding, missed lesions, lack of diagnosis, severe illness requiring hospitalization, as well as anesthesia and sedation related illnesses.  The patient's history has been reviewed, patient examined, no change in status, and deemed stable for procedure.  The patient and/or family is agreeable to proceed.    Justice Britain, MD [facility]oscopy Office # [phone]

## 2021-06-02 NOTE — Progress Notes (Signed)
Called to room to assist during endoscopic procedure.  Patient ID and intended procedure confirmed with present staff. Received instructions for my participation in the procedure from the performing physician.  

## 2021-06-02 NOTE — Progress Notes (Signed)
Sedate, gd SR, tolerated procedure well, VSS, report to RN 

## 2021-06-04 ENCOUNTER — Telehealth: Payer: Self-pay

## 2021-06-04 NOTE — Telephone Encounter (Signed)
°  Follow up Call-  Call back number 06/02/2021  Post procedure Call Back phone  # [phone]  Permission to leave phone message Yes  Some recent data might be hidden     Patient questions:  Do you have a fever, pain , or abdominal swelling? No. Pain Score  0 *  Have you tolerated food without any problems? Yes.    Have you been able to return to your normal activities? Yes.    Do you have any questions about your discharge instructions: Diet   No. Medications  No. Follow up visit  No.  Do you have questions or concerns about your Care? No.  Actions: * If pain score is 4 or above: No action needed, pain <4.

## 2021-06-05 ENCOUNTER — Encounter: Payer: Self-pay | Admitting: Gastroenterology

## 2022-02-21 ENCOUNTER — Encounter: Payer: Self-pay | Admitting: *Deleted

## 2022-03-21 ENCOUNTER — Encounter: Payer: No Typology Code available for payment source | Admitting: Family Medicine

## 2022-03-23 ENCOUNTER — Encounter: Payer: No Typology Code available for payment source | Admitting: Family Medicine

## 2022-03-23 ENCOUNTER — Encounter: Payer: Self-pay | Admitting: Family Medicine

## 2022-03-23 ENCOUNTER — Ambulatory Visit (INDEPENDENT_AMBULATORY_CARE_PROVIDER_SITE_OTHER): Payer: No Typology Code available for payment source | Admitting: Family Medicine

## 2022-03-23 VITALS — BP 113/72 | HR 60 | Temp 98.0°F | Ht 78.0 in | Wt 272.8 lb

## 2022-03-23 DIAGNOSIS — Z125 Encounter for screening for malignant neoplasm of prostate: Secondary | ICD-10-CM | POA: Diagnosis not present

## 2022-03-23 DIAGNOSIS — Z0001 Encounter for general adult medical examination with abnormal findings: Secondary | ICD-10-CM | POA: Diagnosis not present

## 2022-03-23 DIAGNOSIS — Z23 Encounter for immunization: Secondary | ICD-10-CM

## 2022-03-23 DIAGNOSIS — R739 Hyperglycemia, unspecified: Secondary | ICD-10-CM | POA: Diagnosis not present

## 2022-03-23 DIAGNOSIS — E785 Hyperlipidemia, unspecified: Secondary | ICD-10-CM | POA: Diagnosis not present

## 2022-03-23 LAB — CBC
HCT: 45.3 % (ref 39.0–52.0)
Hemoglobin: 15.1 g/dL (ref 13.0–17.0)
MCHC: 33.4 g/dL (ref 30.0–36.0)
MCV: 86.6 fl (ref 78.0–100.0)
Platelets: 274 10*3/uL (ref 150.0–400.0)
RBC: 5.23 Mil/uL (ref 4.22–5.81)
RDW: 13.7 % (ref 11.5–15.5)
WBC: 6.8 10*3/uL (ref 4.0–10.5)

## 2022-03-23 LAB — COMPREHENSIVE METABOLIC PANEL
ALT: 20 U/L (ref 0–53)
AST: 20 U/L (ref 0–37)
Albumin: 4 g/dL (ref 3.5–5.2)
Alkaline Phosphatase: 58 U/L (ref 39–117)
BUN: 13 mg/dL (ref 6–23)
CO2: 29 mEq/L (ref 19–32)
Calcium: 9.4 mg/dL (ref 8.4–10.5)
Chloride: 104 mEq/L (ref 96–112)
Creatinine, Ser: 0.95 mg/dL (ref 0.40–1.50)
GFR: 85.44 mL/min (ref >60.00)
Glucose, Bld: 112 mg/dL — ABNORMAL HIGH (ref 70–99)
Potassium: 4.5 mEq/L (ref 3.5–5.1)
Sodium: 139 mEq/L (ref 135–145)
Total Bilirubin: 0.7 mg/dL (ref 0.2–1.2)
Total Protein: 6.4 g/dL (ref 6.0–8.3)

## 2022-03-23 LAB — LIPID PANEL
Cholesterol: 170 mg/dL (ref 0–200)
HDL: 42 mg/dL (ref >39.00)
LDL Cholesterol: 115 mg/dL — ABNORMAL HIGH (ref 0–99)
NonHDL: 128.38
Total CHOL/HDL Ratio: 4
Triglycerides: 67 mg/dL (ref 0.0–149.0)
VLDL: 13.4 mg/dL (ref 0.0–40.0)

## 2022-03-23 LAB — HEMOGLOBIN A1C: Hgb A1c MFr Bld: 6.1 % (ref 4.6–6.5)

## 2022-03-23 LAB — PSA: PSA: 0.64 ng/mL (ref 0.10–4.00)

## 2022-03-23 LAB — TSH: TSH: 1.4 u[IU]/mL (ref 0.35–5.50)

## 2022-03-23 NOTE — Assessment & Plan Note (Signed)
Check lipids.x

## 2022-03-23 NOTE — Addendum Note (Signed)
Addended by: Betti Cruz on: 03/23/2022 08:31 AM   Modules accepted: Orders

## 2022-03-23 NOTE — Progress Notes (Signed)
Chief Complaint:  Barry Thomas is a 61 y.o. male who presents today for his annual comprehensive physical exam.    Assessment/Plan:  Chronic Problems Addressed Today: Dyslipidemia Check lipids.x  Hyperglycemia Check A1c.   Preventative Healthcare: Check labs. Tdap given today.  Up-to-date on colon cancer screening.  Patient Counseling(The following topics were reviewed and/or handout was given):  -Nutrition: Stressed importance of moderation in sodium/caffeine intake, saturated fat and cholesterol, caloric balance, sufficient intake of fresh fruits, vegetables, and fiber.  -Stressed the importance of regular exercise.   -Substance Abuse: Discussed cessation/primary prevention of tobacco, alcohol, or other drug use; driving or other dangerous activities under the influence; availability of treatment for abuse.   -Injury prevention: Discussed safety belts, safety helmets, smoke detector, smoking near bedding or upholstery.   -Sexuality: Discussed sexually transmitted diseases, partner selection, use of condoms, avoidance of unintended pregnancy and contraceptive alternatives.   -Dental health: Discussed importance of regular tooth brushing, flossing, and dental visits.  -Health maintenance and immunizations reviewed. Please refer to Health maintenance section.  Return to care in 1 year for next preventative visit.     Subjective:  HPI:  He has no acute complaints today.   Lifestyle Diet: Balanced. Plenty of fruits and vegetables.  Exercise: None specific.      03/23/2022    7:44 AM  Depression screen PHQ 2/9  Decreased Interest 0  Down, Depressed, Hopeless 0  PHQ - 2 Score 0    Health Maintenance Due  Topic Date Due   TETANUS/TDAP  11/13/2020     ROS: Per HPI, otherwise a complete review of systems was negative.   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   Vitreous floaters, left 04/05/2021   Hyperglycemia 01/03/2019   Dyslipidemia 01/03/2019   PVC's (premature ventricular contractions) 10/02/2017   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 03/18/2010   Allergic rhinitis 03/16/2010   CARCINOMA, BASAL CELL, HX OF 03/16/2010   Past Surgical History:  Procedure Laterality Date   COLONOSCOPY  2012   DB-F/V-miralax(exc)-normal-10 yr recall   removal basal cell  2008   2008 and 2010=face and leg- MOHS sx   WISDOM TOOTH EXTRACTION     non-surgical    Family History  Problem Relation Age of Onset   Psoriasis Mother    Hypertension Mother    Atrial fibrillation Mother    Diabetes Father    Cancer Father    Hypertension Father    Cancer Sister    Melanoma Sister    Colon cancer Brother 78   Colon polyps Brother 50   Esophageal cancer Neg Hx    Stomach cancer Neg Hx    Rectal cancer Neg Hx     Medications- reviewed and updated Current Outpatient Medications  Medication Sig Dispense Refill   cetirizine (ZYRTEC) 10 MG tablet Take 10 mg by mouth daily as needed.     Cholecalciferol (VITAMIN D3 PO) Take 1 tablet by mouth daily at 6 (six) AM.  Flaxseed, Linseed, (FLAX SEED OIL PO) Take 1 capsule by mouth daily at 6 (six) AM.     fluticasone (FLONASE) 50 MCG/ACT nasal spray Place into both nostrils daily.     latanoprost (XALATAN) 0.005 % ophthalmic solution Place 1 drop into both eyes at bedtime.     Multiple Vitamin (MULTIVITAMIN PO) Take 1 tablet by mouth daily at 6 (six) AM.     Nutritional Supplements (WELLNESS ESSENTIALS PO) Take 1 capsule by mouth daily. Increases up to 4 capsules daily PRN      vitamin E 180 MG (400 UNITS) capsule Take 400 Units by mouth daily.     No current facility-administered medications for this visit.    Allergies-reviewed and updated Allergies  Allergen Reactions   Aspirin Swelling    Stomach cramps   Nsaids     REACTION: swelling and stomach cramps    Social History   Socioeconomic History   Marital status: Married    Spouse name: Not on file   Number of children: 0   Years of education: Not on file   Highest education level: Not on file  Occupational History   Occupation: Engineer, maintenance (IT) CONTROLLER    Employer: [employer]  Tobacco Use   Smoking status: Never   Smokeless tobacco: Never  Vaping Use   Vaping Use: Never used  Substance and Sexual Activity   Alcohol use: No   Drug use: No   Sexual activity: Yes    Birth control/protection: Condom  Other Topics Concern   Not on file  Social History Narrative   Not on file   Social Determinants of Health   Financial Resource Strain: Not on file  Food Insecurity: Not on file  Transportation Needs: Not on file  Physical Activity: Not on file  Stress: Not on file  Social Connections: Not on file        Objective:  Physical Exam: BP 113/72   Pulse 60   Temp 98 F (36.7 C) (Temporal)   Ht '6\' 6"'$  (1.981 m)   Wt 272 lb 12.8 oz (123.7 kg)   SpO2 98%   BMI 31.53 kg/m   Body mass index is 31.53 kg/m. Wt Readings from Last 3 Encounters:  03/23/22 272 lb 12.8 oz (123.7 kg)  06/02/21 260 lb (117.9 kg)  05/12/21 260 lb (117.9 kg)   Gen: NAD, resting comfortably HEENT: TMs normal bilaterally. OP clear. No thyromegaly noted.  CV: RRR with no murmurs appreciated Pulm: NWOB, CTAB with no crackles, wheezes, or rhonchi GI: Normal bowel sounds present. Soft, Nontender, Nondistended. MSK: no edema, cyanosis, or clubbing noted Skin: warm, dry Neuro: CN2-12 grossly intact. Strength 5/5 in upper and lower extremities. Reflexes symmetric and intact bilaterally.  Psych: Normal affect and  thought content     Isair Inabinet M. Jerline Pain, MD 03/23/2022 8:09 AM

## 2022-03-23 NOTE — Assessment & Plan Note (Signed)
Check A1c. 

## 2022-03-23 NOTE — Patient Instructions (Addendum)
It was very nice to see you today!  We will give you your tetanus shot today.  We will check blood work.  Please continue to work on diet and exercise.  I will see back in year for your next physical.  Come back sooner if needed.  Take care, Dr Jerline Pain  PLEASE NOTE:  If you had any lab tests please let us know if you have not heard back within a few days. You may see your results on mychart before we have a chance to review them but we will give you a call once they are reviewed by [place]. If we ordered any referrals today, please let us know if you have not heard from their office within the next week.   Please try these tips to maintain a healthy lifestyle:  Eat at least 3 REAL meals and 1-2 snacks per day.  Aim for no more than 5 hours between eating.  If you eat breakfast, please do so within one hour of getting up.   Each meal should contain half fruits/vegetables, one quarter protein, and one quarter carbs (no bigger than a computer mouse)  Cut down on sweet beverages. This includes juice, soda, and sweet tea.   Drink at least 1 glass of water with each meal and aim for at least 8 glasses per day  Exercise at least 150 minutes every week.    Preventive Care 37-68 Years Old, Male Preventive care refers to lifestyle choices and visits with your health care provider that can promote health and wellness. Preventive care visits are also called wellness exams. What can I expect for my preventive care visit? Counseling During your preventive care visit, your health care provider may ask about your: Medical history, including: Past medical problems. Family medical history. Current health, including: Emotional well-being. Home life and relationship well-being. Sexual activity. Lifestyle, including: Alcohol, nicotine or tobacco, and drug use. Access to firearms. Diet, exercise, and sleep habits. Safety issues such as seatbelt and bike helmet use. Sunscreen use. Work and work  Statistician. Physical exam Your health care provider will check your: Height and weight. These may be used to calculate your BMI (body mass index). BMI is a measurement that tells if you are at a healthy weight. Waist circumference. This measures the distance around your waistline. This measurement also tells if you are at a healthy weight and may help predict your risk of certain diseases, such as type 2 diabetes and high blood pressure. Heart rate and blood pressure. Body temperature. Skin for abnormal spots. What immunizations do I need?  Vaccines are usually given at various ages, according to a schedule. Your health care provider will recommend vaccines for you based on your age, medical history, and lifestyle or other factors, such as travel or where you work. What tests do I need? Screening Your health care provider may recommend screening tests for certain conditions. This may include: Lipid and cholesterol levels. Diabetes screening. This is done by checking your blood sugar (glucose) after you have not eaten for a while (fasting). Hepatitis B test. Hepatitis C test. HIV (human immunodeficiency virus) test. STI (sexually transmitted infection) testing, if you are at risk. Lung cancer screening. Prostate cancer screening. Colorectal cancer screening. Talk with your health care provider about your test results, treatment options, and if necessary, the need for more tests. Follow these instructions at home: Eating and drinking  Eat a diet that includes fresh fruits and vegetables, whole grains, lean protein, and low-fat dairy products.  Take vitamin and mineral supplements as recommended by your health care provider. Do not drink alcohol if your health care provider tells you not to drink. If you drink alcohol: Limit how much you have to 0-2 drinks a day. Know how much alcohol is in your drink. In the [place] one drink equals one 12 oz bottle of beer (355 mL), one 5 oz glass of  wine (148 mL), or one 1 oz glass of hard liquor (44 mL). Lifestyle Brush your teeth every morning and night with fluoride toothpaste. Floss one time each day. Exercise for at least 30 minutes 5 or more days each week. Do not use any products that contain nicotine or tobacco. These products include cigarettes, chewing tobacco, and vaping devices, such as e-cigarettes. If you need help quitting, ask your health care provider. Do not use drugs. If you are sexually active, practice safe sex. Use a condom or other form of protection to prevent STIs. Take aspirin only as told by your health care provider. Make sure that you understand how much to take and what form to take. Work with your health care provider to find out whether it is safe and beneficial for you to take aspirin daily. Find healthy ways to manage stress, such as: Meditation, yoga, or listening to music. Journaling. Talking to a trusted person. Spending time with friends and family. Minimize exposure to UV radiation to reduce your risk of skin cancer. Safety Always wear your seat belt while driving or riding in a vehicle. Do not drive: If you have been drinking alcohol. Do not ride with someone who has been drinking. When you are tired or distracted. While texting. If you have been using any mind-altering substances or drugs. Wear a helmet and other protective equipment during sports activities. If you have firearms in your house, make sure you follow all gun safety procedures. What's next? Go to your health care provider once a year for an annual wellness visit. Ask your health care provider how often you should have your eyes and teeth checked. Stay up to date on all vaccines. This information is not intended to replace advice given to you by your health care provider. Make sure you discuss any questions you have with your health care provider. Document Revised: 11/11/2020 Document Reviewed: 11/11/2020 Elsevier Patient  Education  [place]

## 2022-03-25 NOTE — Progress Notes (Signed)
Please inform patient of the following:  Sugar and cholesterol borderline elevated but stable compared to previous values.  He may benefit from starting metformin to improve blood sugar and lower risk of progression of diabetes.  Please send in metformin 500 mg to if he is agreeable to start.  He may also benefit from starting a cholesterol medication to improve cholesterol numbers and lower risk of heart attack and stroke. Please send in Lipitor 40 mg daily if he is agreeable to start.  Everything else is stable.  Regardless, he should continue to work on diet and exercise and we can recheck in a year.

## 2022-04-05 ENCOUNTER — Encounter (INDEPENDENT_AMBULATORY_CARE_PROVIDER_SITE_OTHER): Payer: No Typology Code available for payment source | Admitting: Ophthalmology

## 2023-03-27 ENCOUNTER — Encounter: Payer: Self-pay | Admitting: Family Medicine

## 2023-03-27 ENCOUNTER — Ambulatory Visit (INDEPENDENT_AMBULATORY_CARE_PROVIDER_SITE_OTHER): Payer: 59 | Admitting: Family Medicine

## 2023-03-27 VITALS — BP 122/74 | HR 57 | Temp 97.8°F | Ht 78.0 in | Wt 246.2 lb

## 2023-03-27 DIAGNOSIS — R739 Hyperglycemia, unspecified: Secondary | ICD-10-CM

## 2023-03-27 DIAGNOSIS — Z0000 Encounter for general adult medical examination without abnormal findings: Secondary | ICD-10-CM

## 2023-03-27 DIAGNOSIS — E785 Hyperlipidemia, unspecified: Secondary | ICD-10-CM | POA: Diagnosis not present

## 2023-03-27 DIAGNOSIS — Z131 Encounter for screening for diabetes mellitus: Secondary | ICD-10-CM

## 2023-03-27 DIAGNOSIS — Z0001 Encounter for general adult medical examination with abnormal findings: Secondary | ICD-10-CM

## 2023-03-27 LAB — COMPREHENSIVE METABOLIC PANEL
ALT: 17 U/L (ref 0–53)
AST: 18 U/L (ref 0–37)
Albumin: 4.1 g/dL (ref 3.5–5.2)
Alkaline Phosphatase: 61 U/L (ref 39–117)
BUN: 16 mg/dL (ref 6–23)
CO2: 29 meq/L (ref 19–32)
Calcium: 9.8 mg/dL (ref 8.4–10.5)
Chloride: 104 meq/L (ref 96–112)
Creatinine, Ser: 0.98 mg/dL (ref 0.40–1.50)
GFR: 81.73 mL/min (ref >60.00)
Glucose, Bld: 107 mg/dL — ABNORMAL HIGH (ref 70–99)
Potassium: 4.5 meq/L (ref 3.5–5.1)
Sodium: 139 meq/L (ref 135–145)
Total Bilirubin: 0.9 mg/dL (ref 0.2–1.2)
Total Protein: 6.8 g/dL (ref 6.0–8.3)

## 2023-03-27 LAB — LIPID PANEL
Cholesterol: 158 mg/dL (ref 0–200)
HDL: 45 mg/dL (ref >39.00)
LDL Cholesterol: 99 mg/dL (ref 0–99)
NonHDL: 113.43
Total CHOL/HDL Ratio: 4
Triglycerides: 71 mg/dL (ref 0.0–149.0)
VLDL: 14.2 mg/dL (ref 0.0–40.0)

## 2023-03-27 LAB — HEMOGLOBIN A1C: Hgb A1c MFr Bld: 5.8 % (ref 4.6–6.5)

## 2023-03-27 LAB — CBC
HCT: 46.2 % (ref 39.0–52.0)
Hemoglobin: 15 g/dL (ref 13.0–17.0)
MCHC: 32.5 g/dL (ref 30.0–36.0)
MCV: 88.6 fL (ref 78.0–100.0)
Platelets: 274 10*3/uL (ref 150.0–400.0)
RBC: 5.22 Mil/uL (ref 4.22–5.81)
RDW: 13.7 % (ref 11.5–15.5)
WBC: 6.4 10*3/uL (ref 4.0–10.5)

## 2023-03-27 LAB — TSH: TSH: 1.45 u[IU]/mL (ref 0.35–5.50)

## 2023-03-27 NOTE — Assessment & Plan Note (Signed)
Check A1c.  He has done a great job with dietary changes.

## 2023-03-27 NOTE — Patient Instructions (Addendum)
It was very nice to see you today!  We we will check blood today.  Keep up the great work!  Return in about 1 year (around 03/26/2024) for Annual Physical.   Take care, Dr Jimmey Ralph  PLEASE NOTE:  If you had any lab tests, please let us know if you have not heard back within a few days. You may see your results on mychart before we have a chance to review them but we will give you a call once they are reviewed by [place].   If we ordered any referrals today, please let us know if you have not heard from their office within the next week.   If you had any urgent prescriptions sent in today, please check with the pharmacy within an hour of our visit to make sure the prescription was transmitted appropriately.   Please try these tips to maintain a healthy lifestyle:  Eat at least 3 REAL meals and 1-2 snacks per day.  Aim for no more than 5 hours between eating.  If you eat breakfast, please do so within one hour of getting up.   Each meal should contain half fruits/vegetables, one quarter protein, and one quarter carbs (no bigger than a computer mouse)  Cut down on sweet beverages. This includes juice, soda, and sweet tea.   Drink at least 1 glass of water with each meal and aim for at least 8 glasses per day  Exercise at least 150 minutes every week.    Preventive Care 64-49 Years Old, Male Preventive care refers to lifestyle choices and visits with your health care provider that can promote health and wellness. Preventive care visits are also called wellness exams. What can I expect for my preventive care visit? Counseling During your preventive care visit, your health care provider may ask about your: Medical history, including: Past medical problems. Family medical history. Current health, including: Emotional well-being. Home life and relationship well-being. Sexual activity. Lifestyle, including: Alcohol, nicotine or tobacco, and drug use. Access to firearms. Diet, exercise,  and sleep habits. Safety issues such as seatbelt and bike helmet use. Sunscreen use. Work and work Astronomer. Physical exam Your health care provider will check your: Height and weight. These may be used to calculate your BMI (body mass index). BMI is a measurement that tells if you are at a healthy weight. Waist circumference. This measures the distance around your waistline. This measurement also tells if you are at a healthy weight and may help predict your risk of certain diseases, such as type 2 diabetes and high blood pressure. Heart rate and blood pressure. Body temperature. Skin for abnormal spots. What immunizations do I need?  Vaccines are usually given at various ages, according to a schedule. Your health care provider will recommend vaccines for you based on your age, medical history, and lifestyle or other factors, such as travel or where you work. What tests do I need? Screening Your health care provider may recommend screening tests for certain conditions. This may include: Lipid and cholesterol levels. Diabetes screening. This is done by checking your blood sugar (glucose) after you have not eaten for a while (fasting). Hepatitis B test. Hepatitis C test. HIV (human immunodeficiency virus) test. STI (sexually transmitted infection) testing, if you are at risk. Lung cancer screening. Prostate cancer screening. Colorectal cancer screening. Talk with your health care provider about your test results, treatment options, and if necessary, the need for more tests. Follow these instructions at home: Eating and drinking  Eat  a diet that includes fresh fruits and vegetables, whole grains, lean protein, and low-fat dairy products. Take vitamin and mineral supplements as recommended by your health care provider. Do not drink alcohol if your health care provider tells you not to drink. If you drink alcohol: Limit how much you have to 0-2 drinks a day. Know how much alcohol is  in your drink. In the [place] one drink equals one 12 oz bottle of beer (355 mL), one 5 oz glass of wine (148 mL), or one 1 oz glass of hard liquor (44 mL). Lifestyle Brush your teeth every morning and night with fluoride toothpaste. Floss one time each day. Exercise for at least 30 minutes 5 or more days each week. Do not use any products that contain nicotine or tobacco. These products include cigarettes, chewing tobacco, and vaping devices, such as e-cigarettes. If you need help quitting, ask your health care provider. Do not use drugs. If you are sexually active, practice safe sex. Use a condom or other form of protection to prevent STIs. Take aspirin only as told by your health care provider. Make sure that you understand how much to take and what form to take. Work with your health care provider to find out whether it is safe and beneficial for you to take aspirin daily. Find healthy ways to manage stress, such as: Meditation, yoga, or listening to music. Journaling. Talking to a trusted person. Spending time with friends and family. Minimize exposure to UV radiation to reduce your risk of skin cancer. Safety Always wear your seat belt while driving or riding in a vehicle. Do not drive: If you have been drinking alcohol. Do not ride with someone who has been drinking. When you are tired or distracted. While texting. If you have been using any mind-altering substances or drugs. Wear a helmet and other protective equipment during sports activities. If you have firearms in your house, make sure you follow all gun safety procedures. What's next? Go to your health care provider once a year for an annual wellness visit. Ask your health care provider how often you should have your eyes and teeth checked. Stay up to date on all vaccines. This information is not intended to replace advice given to you by your health care provider. Make sure you discuss any questions you have with your health  care provider. Document Revised: 11/11/2020 Document Reviewed: 11/11/2020 Elsevier Patient Education  2024 [employer].

## 2023-03-27 NOTE — Assessment & Plan Note (Signed)
Check lipids 

## 2023-03-27 NOTE — Progress Notes (Signed)
Chief Complaint:  Barry Thomas is a 62 y.o. male who presents today for his annual comprehensive physical exam.    Assessment/Plan:  Chronic Problems Addressed Today: Hyperglycemia Check A1c.  He has done a great job with dietary changes.   Dyslipidemia Check lipids.   Preventative Healthcare: Check labs. Declined flu shot. Due for colonoscopy next year.   Patient Counseling(The following topics were reviewed and/or handout was given):  -Nutrition: Stressed importance of moderation in sodium/caffeine intake, saturated fat and cholesterol, caloric balance, sufficient intake of fresh fruits, vegetables, and fiber.  -Stressed the importance of regular exercise.   -Substance Abuse: Discussed cessation/primary prevention of tobacco, alcohol, or other drug use; driving or other dangerous activities under the influence; availability of treatment for abuse.   -Injury prevention: Discussed safety belts, safety helmets, smoke detector, smoking near bedding or upholstery.   -Sexuality: Discussed sexually transmitted diseases, partner selection, use of condoms, avoidance of unintended pregnancy and contraceptive alternatives.   -Dental health: Discussed importance of regular tooth brushing, flossing, and dental visits.  -Health maintenance and immunizations reviewed. Please refer to Health maintenance section.  Return to care in 1 year for next preventative visit.     Subjective:  HPI:  He has no acute complaints today.   Lifestyle Diet: Cutting down on carbohydrates and sweets.  Exercise: Trying to walk more.      03/27/2023    8:09 AM  Depression screen PHQ 2/9  Decreased Interest 0  Down, Depressed, Hopeless 0  PHQ - 2 Score 0    There are no preventive care reminders to display for this patient.   ROS: Per HPI, otherwise a complete review of systems was negative.   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   Vitreous floaters, left 04/05/2021   Hyperglycemia 01/03/2019   Dyslipidemia 01/03/2019   PVC's (premature ventricular contractions) 10/02/2017   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 03/18/2010   Allergic rhinitis 03/16/2010   CARCINOMA, BASAL CELL, HX OF 03/16/2010   Past Surgical History:  Procedure Laterality Date   COLONOSCOPY  2012   DB-F/V-miralax(exc)-normal-10 yr recall   removal basal cell  2008   2008 and 2010=face and leg- MOHS sx   WISDOM TOOTH EXTRACTION     non-surgical    Family History  Problem Relation Age of Onset   Psoriasis Mother    Hypertension Mother    Atrial fibrillation Mother    Diabetes Father    Cancer Father    Hypertension Father    Cancer Sister    Melanoma Sister    Colon cancer Brother 66   Colon polyps Brother 61   Esophageal cancer Neg Hx    Stomach cancer Neg Hx    Rectal cancer Neg Hx     Medications- reviewed and updated Current Outpatient Medications  Medication Sig Dispense Refill   cetirizine (ZYRTEC) 10 MG tablet Take 10 mg by mouth daily as needed.     Cholecalciferol (VITAMIN D3 PO) Take  1 tablet by mouth daily at 6 (six) AM.     Flaxseed, Linseed, (FLAX SEED OIL PO) Take 1 capsule by mouth daily at 6 (six) AM.     fluticasone (FLONASE) 50 MCG/ACT nasal spray Place into both nostrils daily.     latanoprost (XALATAN) 0.005 % ophthalmic solution Place 1 drop into both eyes at bedtime.     Multiple Vitamin (MULTIVITAMIN PO) Take 1 tablet by mouth daily at 6 (six) AM.     Nutritional Supplements (WELLNESS ESSENTIALS PO) Take 1 capsule by mouth daily.  Increases up to 4 capsules daily PRN     vitamin E 180 MG (400 UNITS) capsule Take 400 Units by mouth daily.     Zinc 10 MG LOZG Use as directed in the mouth or throat.     No current facility-administered medications for this visit.    Allergies-reviewed and updated Allergies  Allergen Reactions   Aspirin Swelling    Stomach cramps   Nsaids     REACTION: swelling and stomach cramps    Social History   Socioeconomic History   Marital status: Married    Spouse name: Not on file   Number of children: 0   Years of education: Not on file   Highest education level: Not on file  Occupational History   Occupation: IT trainer CONTROLLER    Employer: [employer]  Tobacco Use   Smoking status: Never   Smokeless tobacco: Never  Vaping Use   Vaping status: Never Used  Substance and Sexual Activity   Alcohol use: No   Drug use: No   Sexual activity: Yes    Birth control/protection: Condom  Other Topics Concern   Not on file  Social History Narrative   Not on file   Social Determinants of Health   Financial Resource Strain: Not on file  Food Insecurity: Not on file  Transportation Needs: Not on file  Physical Activity: Not on file  Stress: Not on file  Social Connections: Not on file        Objective:  Physical Exam: BP 122/74   Pulse (!) 57   Temp 97.8 F (36.6 C) (Temporal)   Ht 6\' 6"  (1.981 m)   Wt 246 lb 3.2 oz (111.7 kg)   SpO2 99%   BMI 28.45 kg/m   Body mass index is 28.45 kg/m. Wt Readings from Last 3 Encounters:  03/27/23 246 lb 3.2 oz (111.7 kg)  03/23/22 272 lb 12.8 oz (123.7 kg)  06/02/21 260 lb (117.9 kg)   Gen: NAD, resting comfortably HEENT: TMs normal bilaterally. OP clear. No thyromegaly noted.  CV: RRR with no murmurs appreciated Pulm: NWOB, CTAB with no crackles, wheezes, or rhonchi GI: Normal bowel sounds present. Soft, Nontender, Nondistended. MSK: no edema, cyanosis, or clubbing noted Skin: warm, dry Neuro: CN2-12 grossly intact.  Strength 5/5 in upper and lower extremities. Reflexes symmetric and intact bilaterally.  Psych: Normal affect and thought content     Eames Dibiasio M. Jimmey Ralph, MD 03/27/2023 8:29 AM

## 2023-03-28 NOTE — Progress Notes (Signed)
Labs are all stable.  Sugar borderline elevated but better than last year.  Cholesterol is better than last year as well.  He should continue the great work with diet and exercise and we can recheck everything in a year or so.

## 2023-11-02 ENCOUNTER — Telehealth: Payer: Self-pay

## 2023-11-02 NOTE — Telephone Encounter (Signed)
 That is ok

## 2023-11-02 NOTE — Telephone Encounter (Signed)
 Copied from CRM [id number]. Topic: Appointments - Transfer of Care >> Nov 02, 2023 11:35 AM Bambi Bonine D wrote: Pt is requesting to transfer FROM: [facility] at [facility], Chief Operating Officer Pt is requesting to transfer TO: [facility], Dr.Spencer Copland Reason for requested transfer: Better Location It is the responsibility of the team the patient would like to transfer to (Dr. Geralyn Knee) to reach out to the patient if for any reason this transfer is not acceptable.

## 2023-11-02 NOTE — Telephone Encounter (Signed)
 Pt already sch toc visit on 03/13/24

## 2023-11-03 NOTE — Telephone Encounter (Signed)
 Ok with me.   Jinny Mounts. Daneil Dunker, MD 11/03/2023 7:38 AM

## 2023-11-18 ENCOUNTER — Ambulatory Visit
Admission: EM | Admit: 2023-11-18 | Discharge: 2023-11-18 | Disposition: A | Discharge status: Home / Self Care | Attending: Internal Medicine | Admitting: Internal Medicine

## 2023-11-18 DIAGNOSIS — L089 Local infection of the skin and subcutaneous tissue, unspecified: Secondary | ICD-10-CM | POA: Diagnosis not present

## 2023-11-18 DIAGNOSIS — W57XXXA Bitten or stung by nonvenomous insect and other nonvenomous arthropods, initial encounter: Secondary | ICD-10-CM

## 2023-11-18 DIAGNOSIS — S80861A Insect bite (nonvenomous), right lower leg, initial encounter: Secondary | ICD-10-CM | POA: Diagnosis not present

## 2023-11-18 MED ORDER — DOXYCYCLINE HYCLATE 100 MG PO CAPS: 100.0000 mg | ORAL_CAPSULE | Freq: Two times a day (BID) | ORAL | 0 refills | Status: DC

## 2023-11-18 NOTE — ED Provider Notes (Signed)
 [facility]    CSN: [id number] Arrival date & time: 11/18/23  0900      History   Chief Complaint Chief Complaint  Patient presents with   Insect Bite    HPI Barry Thomas is a 63 y.o. male.   Patient presents to urgent care for evaluation of possible infected insect bite to the right thigh.  He has not pulled any ticks off of his right upper thigh but states he is prone to tick bites.  He first noticed the insect bites 5 days ago.  2 bites to the right upper inner thigh are red, swollen, and appear to have a little bit of pus in the middle.  He noticed red streaking from one of the bites yesterday causing increasing concern for infection.  The recent fever, chills, nausea, vomiting, diarrhea, abdominal pain, dizziness, neck pain, and recent antibiotic or steroid use.  Bites were itchy initially, itching has now subsided.  He has not attempted treatment of symptoms PTA.     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 Authorizing Provider  Cholecalciferol (VITAMIN D3 PO) Take 1 tablet by mouth daily at 6 (six) AM.   Yes [provider]  doxycycline  (VIBRAMYCIN ) 100 MG capsule Take 1 capsule (100 mg total) by mouth 2 (two) times daily. 11/18/23  Yes Kynadi Dragos, Dorna HERO, FNP  Flaxseed, Linseed, (FLAX SEED OIL PO) Take 1 capsule by mouth daily at 6 (six) AM.   Yes [provider]  latanoprost (XALATAN) 0.005 % ophthalmic solution Place 1 drop into both eyes at bedtime. 10/02/20  Yes [provider]  Multiple Vitamin (MULTIVITAMIN PO) Take 1 tablet by mouth daily at 6 (six) AM.   Yes [provider]  vitamin E 180 MG (400 UNITS) capsule Take 400 Units by mouth daily.   Yes [provider]  Zinc 10 MG LOZG Use as directed in the mouth or throat.   Yes [provider]  cetirizine (ZYRTEC) 10 MG tablet Take 10 mg by mouth daily as needed.    [provider]  fluticasone (FLONASE) 50 MCG/ACT nasal spray Place into both nostrils daily.    [provider]  Nutritional Supplements (WELLNESS ESSENTIALS PO) Take 1 capsule by mouth daily. Increases up to 4 capsules daily PRN    [provider]    Family History Family History  Problem Relation Age of Onset   Psoriasis Mother    Hypertension Mother    Atrial fibrillation Mother    Diabetes Father    Cancer Father    Hypertension Father    Cancer Sister    Melanoma Sister    Colon cancer Brother 9   Colon polyps Brother 23   Esophageal cancer Neg Hx    Stomach cancer Neg Hx    Rectal cancer Neg Hx     Social History Social History   Tobacco Use   Smoking status: Never   Smokeless tobacco: Never  Vaping Use   Vaping status: Never Used  Substance Use Topics   Alcohol use: No   Drug use: No     Allergies   Aspirin and Nsaids   Review of Systems Review of Systems Per HPI  Physical Exam Triage Vital Signs ED Triage Vitals  Encounter  Vitals Group     BP 11/18/23 0914 (!) 162/81     Girls Systolic BP Percentile --      Girls Diastolic BP Percentile --      Boys Systolic BP Percentile --      Boys Diastolic BP Percentile --      Pulse Rate 11/18/23 0914 64     Resp 11/18/23 0914 18     Temp 11/18/23 0914 98.3 F (36.8 C)     Temp Source 11/18/23 0914 Oral     SpO2 11/18/23 0914 97 %     Weight --      Height --      Head Circumference --      Peak Flow --      Pain Score 11/18/23 0917 0     Pain Loc --      Pain Education --      Exclude from Growth Chart --    No data found.  Updated Vital Signs BP (!) 162/81 (BP Location: Right Arm)   Pulse 64   Temp 98.3 F (36.8 C) (Oral)   Resp 18   SpO2 97%   Visual Acuity Right Eye Distance:   Left Eye Distance:   Bilateral Distance:    Right Eye Near:   Left Eye Near:    Bilateral Near:     Physical Exam Vitals and nursing note reviewed.  Constitutional:      Appearance: He is not ill-appearing or toxic-appearing.  HENT:     Head: Normocephalic and atraumatic.     Right Ear: Hearing and external ear normal.     Left Ear: Hearing and external ear normal.     Nose: Nose normal.     Mouth/Throat:     Lips: Pink.   Eyes:     General: Lids are normal. Vision grossly intact. Gaze aligned appropriately.     Extraocular Movements: Extraocular movements intact.     Conjunctiva/sclera: Conjunctivae normal.   Pulmonary:     Effort: Pulmonary effort is normal.   Musculoskeletal:     Cervical back: Neck supple.   Skin:    General: Skin is warm and dry.     Capillary Refill: Capillary refill takes less than 2 seconds.     Findings: Lesion (2 erythematous pustular lesions to the right upper thigh.  Lateral lesion with red streaking appearance.) present. No rash.       Neurological:     General: No focal deficit present.     Mental Status: He is alert and oriented to person, place, and time. Mental status  is at baseline.     Cranial Nerves: No  dysarthria or facial asymmetry.   Psychiatric:        Mood and Affect: Mood normal.        Speech: Speech normal.        Behavior: Behavior normal.        Thought Content: Thought content normal.        Judgment: Judgment normal.    Right upper thigh   UC Treatments / Results  Labs (all labs ordered are listed, but only abnormal results are displayed) Labs Reviewed - No data to display  EKG   Radiology No results found.  Procedures Procedures (including critical care time)  Medications Ordered in UC Medications - No data to display  Initial Impression / Assessment and Plan / UC Course  I have reviewed the triage vital signs and the nursing notes.  Pertinent labs & imaging results that were available during my care of the patient were reviewed by me and considered in my medical decision making (see chart for details).   1.  Infected insect bite of right lower extremity Pustular lesions appear to be infected insect bites. Will treat with doxycycline  twice daily for 10 days to cover for tickborne illness.  Warm compresses recommended.  No current systemic signs of infection. Infection return precautions discussed.  Counseled patient on potential for adverse effects with medications prescribed/recommended today, strict ER and return-to-clinic precautions discussed, patient verbalized understanding.    Final Clinical Impressions(s) / UC Diagnoses   Final diagnoses:  Infected insect bite of right lower extremity, initial encounter     Discharge Instructions      You have cellulitis which is a superficial skin infection.  Take antibiotic every 12 hours with a snack/food for the next 10 days. Watch for worsening signs of infection to the lesion such as spreading redness, swelling, pus drainage, pain, fever/chills. Return if the redness spreads significantly in the next 24-48 hours with use of the antibiotic.  Return if you develop fever, chills, nausea, vomiting, etc  as well.  If you develop any new or worsening symptoms or if your symptoms do not start to improve, please return here or follow-up with your primary care provider. If your symptoms are severe, please go to the emergency room.   ED Prescriptions     Medication Sig Dispense Auth. Provider   doxycycline  (VIBRAMYCIN ) 100 MG capsule Take 1 capsule (100 mg total) by mouth 2 (two) times daily. 20 capsule Enedelia Dorna HERO, FNP      PDMP not reviewed this encounter.   Enedelia Dorna [place], [place] 11/18/23 [phone]

## 2023-11-18 NOTE — Discharge Instructions (Signed)
 You have cellulitis which is a superficial skin infection.  Take antibiotic every 12 hours with a snack/food for the next 10 days. Watch for worsening signs of infection to the lesion such as spreading redness, swelling, pus drainage, pain, fever/chills. Return if the redness spreads significantly in the next 24-48 hours with use of the antibiotic.  Return if you develop fever, chills, nausea, vomiting, etc as well.  If you develop any new or worsening symptoms or if your symptoms do not start to improve, please return here or follow-up with your primary care provider. If your symptoms are severe, please go to the emergency room.

## 2023-11-18 NOTE — ED Triage Notes (Signed)
 Pt c/o 3 bumps/possible insect bite to right upper thigh x 4 days, pt states he noticed the red streaking from the bite yesterday.

## 2023-12-27 ENCOUNTER — Encounter: Payer: Self-pay | Admitting: Dermatology

## 2024-03-10 ENCOUNTER — Encounter: Payer: Self-pay | Admitting: Family Medicine

## 2024-03-10 DIAGNOSIS — R7303 Prediabetes: Secondary | ICD-10-CM | POA: Insufficient documentation

## 2024-03-10 NOTE — Progress Notes (Signed)
     Barry Wolfinger T. Harwood Nall, MD, CAQ Sports Medicine [facility] [address] [place], [address]  Phone: [phone]  FAX: [phone]  Barry Thomas - 63 y.o. male  MRN [id number]  Date of Birth: 1960-08-18  Date: 03/13/2024  PCP: Kennyth Worth HERO, MD  Referral: Kennyth Worth HERO, MD  No chief complaint on file.  Subjective:   Barry Thomas is a 63 y.o. very pleasant male patient with There is no height or weight on file to calculate BMI. who presents with the following:  Discussed the use of AI scribe software for clinical note transcription with the patient, who gave verbal consent to proceed.   History of Present Illness     Review of Systems is noted in the HPI, as appropriate  Objective:   There were no vitals taken for this visit.  GEN: No acute distress; alert,appropriate. PULM: Breathing comfortably in no respiratory distress PSYCH: Normally interactive.   Laboratory and Imaging Data:  Assessment and Plan:     ICD-10-CM   1. Prediabetes  R73.03      Assessment & Plan   Medication Management during today's office visit: No orders of the defined types were placed in this encounter.  Medications Discontinued During This Encounter  Medication Reason   doxycycline  (VIBRAMYCIN ) 100 MG capsule     Orders placed today for conditions managed today: No orders of the defined types were placed in this encounter.   Disposition: No follow-ups on file.  [employer] speech-to-text software was used for transcription in this dictation.  Possible transcriptional errors can occur using Animal nutritionist.   Signed,  Jacques DASEN. Keyera Hattabaugh, MD   Outpatient Encounter Medications as of 03/13/2024  Medication Sig   cetirizine (ZYRTEC) 10 MG tablet Take 10 mg by mouth daily as needed.   Cholecalciferol (VITAMIN D3 PO) Take 1 tablet by mouth daily at 6 (six) AM.   Flaxseed, Linseed, (FLAX SEED OIL PO) Take 1 capsule by  mouth daily at 6 (six) AM.   fluticasone (FLONASE) 50 MCG/ACT nasal spray Place into both nostrils daily.   latanoprost (XALATAN) 0.005 % ophthalmic solution Place 1 drop into both eyes at bedtime.   Multiple Vitamin (MULTIVITAMIN PO) Take 1 tablet by mouth daily at 6 (six) AM.   Nutritional Supplements (WELLNESS ESSENTIALS PO) Take 1 capsule by mouth daily. Increases up to 4 capsules daily PRN   vitamin E 180 MG (400 UNITS) capsule Take 400 Units by mouth daily.   Zinc 10 MG LOZG Use as directed in the mouth or throat.   [DISCONTINUED] doxycycline  (VIBRAMYCIN ) 100 MG capsule Take 1 capsule (100 mg total) by mouth 2 (two) times daily.   No facility-administered encounter medications on file as of 03/13/2024.

## 2024-03-13 ENCOUNTER — Ambulatory Visit (INDEPENDENT_AMBULATORY_CARE_PROVIDER_SITE_OTHER): Admitting: Family Medicine

## 2024-03-13 ENCOUNTER — Encounter: Payer: Self-pay | Admitting: Family Medicine

## 2024-03-13 ENCOUNTER — Encounter: Admitting: Nurse Practitioner

## 2024-03-13 VITALS — BP 110/60 | HR 57 | Temp 97.7°F | Ht 77.25 in | Wt 237.4 lb

## 2024-03-13 DIAGNOSIS — R7303 Prediabetes: Secondary | ICD-10-CM | POA: Diagnosis not present

## 2024-03-13 DIAGNOSIS — E782 Mixed hyperlipidemia: Secondary | ICD-10-CM

## 2024-03-13 DIAGNOSIS — Z0000 Encounter for general adult medical examination without abnormal findings: Secondary | ICD-10-CM | POA: Diagnosis not present

## 2024-03-13 DIAGNOSIS — J301 Allergic rhinitis due to pollen: Secondary | ICD-10-CM | POA: Diagnosis not present

## 2024-03-13 DIAGNOSIS — Z125 Encounter for screening for malignant neoplasm of prostate: Secondary | ICD-10-CM

## 2024-03-13 DIAGNOSIS — R5383 Other fatigue: Secondary | ICD-10-CM | POA: Diagnosis not present

## 2024-03-13 LAB — BASIC METABOLIC PANEL WITH GFR
BUN: 18 mg/dL (ref 6–23)
CO2: 32 meq/L (ref 19–32)
Calcium: 9.6 mg/dL (ref 8.4–10.5)
Chloride: 103 meq/L (ref 96–112)
Creatinine, Ser: 0.97 mg/dL (ref 0.40–1.50)
GFR: 82.18 mL/min (ref >60.00)
Glucose, Bld: 108 mg/dL — ABNORMAL HIGH (ref 70–99)
Potassium: 4.5 meq/L (ref 3.5–5.1)
Sodium: 140 meq/L (ref 135–145)

## 2024-03-13 LAB — LIPID PANEL
Cholesterol: 166 mg/dL (ref 0–200)
HDL: 44.8 mg/dL (ref >39.00)
LDL Cholesterol: 108 mg/dL — ABNORMAL HIGH (ref 0–99)
NonHDL: 121.18
Total CHOL/HDL Ratio: 4
Triglycerides: 64 mg/dL (ref 0.0–149.0)
VLDL: 12.8 mg/dL (ref 0.0–40.0)

## 2024-03-13 LAB — CBC WITH DIFFERENTIAL/PLATELET
Basophils Absolute: 0 K/uL (ref 0.0–0.1)
Basophils Relative: 0.6 % (ref 0.0–3.0)
Eosinophils Absolute: 0.2 K/uL (ref 0.0–0.7)
Eosinophils Relative: 2.5 % (ref 0.0–5.0)
HCT: 46.7 % (ref 39.0–52.0)
Hemoglobin: 15.3 g/dL (ref 13.0–17.0)
Lymphocytes Relative: 24.2 % (ref 12.0–46.0)
Lymphs Abs: 1.7 K/uL (ref 0.7–4.0)
MCHC: 32.7 g/dL (ref 30.0–36.0)
MCV: 88.1 fl (ref 78.0–100.0)
Monocytes Absolute: 0.5 K/uL (ref 0.1–1.0)
Monocytes Relative: 6.8 % (ref 3.0–12.0)
Neutro Abs: 4.6 K/uL (ref 1.4–7.7)
Neutrophils Relative %: 65.9 % (ref 43.0–77.0)
Platelets: 249 K/uL (ref 150.0–400.0)
RBC: 5.3 Mil/uL (ref 4.22–5.81)
RDW: 13.8 % (ref 11.5–15.5)
WBC: 7 K/uL (ref 4.0–10.5)

## 2024-03-13 LAB — HEPATIC FUNCTION PANEL
ALT: 18 U/L (ref 0–53)
AST: 15 U/L (ref 0–37)
Albumin: 4.4 g/dL (ref 3.5–5.2)
Alkaline Phosphatase: 66 U/L (ref 39–117)
Bilirubin, Direct: 0.2 mg/dL (ref 0.0–0.3)
Total Bilirubin: 0.9 mg/dL (ref 0.2–1.2)
Total Protein: 6.7 g/dL (ref 6.0–8.3)

## 2024-03-13 LAB — TSH: TSH: 1.1 u[IU]/mL (ref 0.35–5.50)

## 2024-03-13 LAB — HEMOGLOBIN A1C: Hgb A1c MFr Bld: 5.9 % (ref 4.6–6.5)

## 2024-03-15 ENCOUNTER — Ambulatory Visit: Payer: Self-pay | Admitting: Family Medicine

## 2024-03-15 LAB — PSA, TOTAL WITH REFLEX TO PSA, FREE: PSA, Total: 0.7 ng/mL (ref <=4.0)

## 2024-03-27 ENCOUNTER — Encounter: Admitting: Family Medicine

## 2024-05-16 LAB — OPHTHALMOLOGY REPORT-SCANNED

## 2025-03-10 ENCOUNTER — Other Ambulatory Visit

## 2025-03-17 ENCOUNTER — Encounter: Admitting: Family Medicine
